# Patient Record
Sex: Male | Born: 1965 | Race: Black or African American | Hispanic: No | Marital: Single | Smoking: Never smoker
Health system: Southern US, Community
[De-identification: ages and names within clinical notes are randomized; demographics above are authoritative.]

## PROBLEM LIST (undated history)

## (undated) DIAGNOSIS — U071 COVID-19: Secondary | ICD-10-CM

## (undated) HISTORY — DX: COVID-19: U07.1

---

## 2019-06-24 ENCOUNTER — Other Ambulatory Visit: Payer: Self-pay

## 2019-06-24 ENCOUNTER — Inpatient Hospital Stay (HOSPITAL_COMMUNITY)
Admission: EM | Admit: 2019-06-24 | Discharge: 2019-06-29 | DRG: 177 | Disposition: A | Discharge status: Home / Self Care | Payer: HRSA Program | Attending: Internal Medicine | Admitting: Internal Medicine

## 2019-06-24 ENCOUNTER — Encounter (HOSPITAL_COMMUNITY): Payer: Self-pay | Admitting: Emergency Medicine

## 2019-06-24 ENCOUNTER — Emergency Department (HOSPITAL_COMMUNITY): Payer: HRSA Program

## 2019-06-24 DIAGNOSIS — A4189 Other specified sepsis: Secondary | ICD-10-CM | POA: Diagnosis not present

## 2019-06-24 DIAGNOSIS — J9601 Acute respiratory failure with hypoxia: Secondary | ICD-10-CM | POA: Diagnosis present

## 2019-06-24 DIAGNOSIS — E860 Dehydration: Secondary | ICD-10-CM | POA: Diagnosis present

## 2019-06-24 DIAGNOSIS — R6520 Severe sepsis without septic shock: Secondary | ICD-10-CM | POA: Diagnosis present

## 2019-06-24 DIAGNOSIS — N179 Acute kidney failure, unspecified: Secondary | ICD-10-CM | POA: Diagnosis present

## 2019-06-24 DIAGNOSIS — Z7982 Long term (current) use of aspirin: Secondary | ICD-10-CM | POA: Diagnosis not present

## 2019-06-24 DIAGNOSIS — J1282 Pneumonia due to coronavirus disease 2019: Secondary | ICD-10-CM | POA: Diagnosis present

## 2019-06-24 DIAGNOSIS — U071 COVID-19: Secondary | ICD-10-CM | POA: Diagnosis present

## 2019-06-24 DIAGNOSIS — A419 Sepsis, unspecified organism: Secondary | ICD-10-CM | POA: Diagnosis not present

## 2019-06-24 DIAGNOSIS — E876 Hypokalemia: Secondary | ICD-10-CM | POA: Diagnosis not present

## 2019-06-24 DIAGNOSIS — J1289 Other viral pneumonia: Secondary | ICD-10-CM | POA: Diagnosis present

## 2019-06-24 LAB — CBC WITH DIFFERENTIAL/PLATELET
Abs Immature Granulocytes: 0.06 10*3/uL (ref 0.00–0.07)
Basophils Absolute: 0 10*3/uL (ref 0.0–0.1)
Basophils Relative: 0 %
Eosinophils Absolute: 0 10*3/uL (ref 0.0–0.5)
Eosinophils Relative: 0 %
HCT: 44.9 % (ref 39.0–52.0)
Hemoglobin: 13.9 g/dL (ref 13.0–17.0)
Immature Granulocytes: 1 %
Lymphocytes Relative: 18 %
Lymphs Abs: 1.7 10*3/uL (ref 0.7–4.0)
MCH: 26.5 pg (ref 26.0–34.0)
MCHC: 31 g/dL (ref 30.0–36.0)
MCV: 85.5 fL (ref 80.0–100.0)
Monocytes Absolute: 0.5 10*3/uL (ref 0.1–1.0)
Monocytes Relative: 5 %
Neutro Abs: 7.3 10*3/uL (ref 1.7–7.7)
Neutrophils Relative %: 76 %
Platelets: 246 10*3/uL (ref 150–400)
RBC: 5.25 MIL/uL (ref 4.22–5.81)
RDW: 13.2 % (ref 11.5–15.5)
WBC: 9.6 10*3/uL (ref 4.0–10.5)
nRBC: 0 % (ref 0.0–0.2)

## 2019-06-24 LAB — SARS CORONAVIRUS 2 BY RT PCR (HOSPITAL ORDER, PERFORMED IN ~~LOC~~ HOSPITAL LAB): SARS Coronavirus 2: POSITIVE — AB

## 2019-06-24 LAB — BASIC METABOLIC PANEL
Anion gap: 14 (ref 5–15)
BUN: 10 mg/dL (ref 6–20)
CO2: 23 mmol/L (ref 22–32)
Calcium: 8.3 mg/dL — ABNORMAL LOW (ref 8.9–10.3)
Chloride: 96 mmol/L — ABNORMAL LOW (ref 98–111)
Creatinine, Ser: 1.43 mg/dL — ABNORMAL HIGH (ref 0.61–1.24)
GFR calc Af Amer: >60 mL/min (ref >60)
GFR calc non Af Amer: 55 mL/min — ABNORMAL LOW (ref >60)
Glucose, Bld: 114 mg/dL — ABNORMAL HIGH (ref 70–99)
Potassium: 3.8 mmol/L (ref 3.5–5.1)
Sodium: 133 mmol/L — ABNORMAL LOW (ref 135–145)

## 2019-06-24 LAB — TROPONIN I (HIGH SENSITIVITY)
Troponin I (High Sensitivity): 10 ng/L (ref <18)
Troponin I (High Sensitivity): 13 ng/L (ref <18)

## 2019-06-24 MED ORDER — ACETAMINOPHEN 325 MG PO TABS
650.0000 mg | ORAL_TABLET | Freq: Once | ORAL | Status: AC
  Administered 2019-06-24: 19:00:00 650 mg via ORAL
  Filled 2019-06-24: qty 2

## 2019-06-24 NOTE — ED Triage Notes (Signed)
Pt reports SHOB x 1 week. Pt reports fever, and cough. Pt denies sick contact. O2 sats 87% on room air. Pt's temp 101.4.

## 2019-06-24 NOTE — H&P (Signed)
History and Physical  Thomas Saunders WUJ:[id number] DOB: 1966-04-29 DOA: 06/24/2019  Referring physician: ER provider PCP: No primary care provider on file.  Outpatient Specialists:    Patient coming from: Home  Chief Complaint: Fever and shortness of breath for 1 week.  HPI: Patient is a 53 year old with no significant past medical history.  Patient lives with his sister who works as a care.  Patient presents with 1 week history of fever, shortness of breath and cough.  The cough is nonproductive.  No headache, no neck pain, no chest pain, no GI symptoms, no urinary symptoms, no loss of taste or smell.  On presentation to the hospital, patient was found to have positive COVID test.  Sodium is 133 with serum creatinine of 1.43.  Patient's baseline is unknown.  Patient will be admitted for further assessment and management.  ED Course: On presentation, temperature was 101.4 F, heart rate of 76, respiratory rate of 18 to 41/min, blood pressure 113/71 and O2 sat of 87 to 98%.  Lab work is as documented below.  COVID-19 repeat test came back positive.  Sodium is 133, serum creatinine is 1.43.  Chest x-ray reveals "Bilateral hazy densities concerning for developing infiltrate".  Pertinent labs: Chemistry reveals sodium of 133, potassium of 3.8, chloride of 96, CO2 23, BUN of 10, creatinine of 1.43 with a blood sugar of 114.  High sensitive troponin is negative on 2 occasions.  CBC reveals WBC of 9.6, hemoglobin of 13.9, hematocrit of 44.9, MCV of 85.5 with platelet count of 246.  This is positive.  Chest x-ray is as documented above.  EKG: Independently reviewed.   Imaging: independently reviewed.   Review of Systems: Negative for visual changes, sore throat, rash, new muscle aches, chest pain, dysuria, bleeding, n/v/abdominal pain.  History reviewed. No pertinent past medical history.  History reviewed. No pertinent surgical history.   reports that he has never smoked. He has never used  smokeless tobacco. He reports that he does not drink alcohol or use drugs.  No Known Allergies  No family history on file.   Prior to Admission medications   Medication Sig Start Date End Date Taking? Authorizing Provider  aspirin EC 81 MG tablet Take 81 mg by mouth daily.   Yes [provider]    Physical Exam: Vitals:   06/24/19 2100 06/24/19 2130 06/24/19 2200 06/24/19 2230  BP: 118/74 114/84 126/78 113/71  Pulse: 81 82 79 76  Resp: (!) 37 (!) 21 (!) 41 (!) 32  Temp:      TempSrc:      SpO2: 97% 97% 97% 96%    Constitutional:  . Appears calm and comfortable Eyes:  . No pallor. No jaundice.  ENMT:  . external ears, nose appear normal Neck:  . Neck is supple. No JVD Respiratory:  . CTA bilaterally, no w/r/r.  . Respiratory effort normal. No retractions or accessory muscle use Cardiovascular:  . S1S2 . No LE extremity edema   Abdomen:  . Abdomen is soft and non tender. Organs are difficult to assess. Neurologic:  . Awake and alert. . Moves all limbs.  Wt Readings from Last 3 Encounters:  No data found for Wt    I have personally reviewed following labs and imaging studies  Labs on Admission:  CBC: Recent Labs  Lab 06/24/19 1756  WBC 9.6  NEUTROABS 7.3  HGB 13.9  HCT 44.9  MCV 85.5  PLT 246   Basic Metabolic Panel: Recent Labs  Lab 06/24/19  1755  NA 133*  K 3.8  CL 96*  CO2 23  GLUCOSE 114*  BUN 10  CREATININE 1.43*  CALCIUM 8.3*   Liver Function Tests: No results for input(s): AST, ALT, ALKPHOS, BILITOT, PROT, ALBUMIN in the last 168 hours. No results for input(s): LIPASE, AMYLASE in the last 168 hours. No results for input(s): AMMONIA in the last 168 hours. Coagulation Profile: No results for input(s): INR, PROTIME in the last 168 hours. Cardiac Enzymes: No results for input(s): CKTOTAL, CKMB, CKMBINDEX, TROPONINI in the last 168 hours. BNP (last 3 results) No results for input(s): PROBNP in the last 8760 hours. HbA1C: No  results for input(s): HGBA1C in the last 72 hours. CBG: No results for input(s): GLUCAP in the last 168 hours. Lipid Profile: No results for input(s): CHOL, HDL, LDLCALC, TRIG, CHOLHDL, LDLDIRECT in the last 72 hours. Thyroid Function Tests: No results for input(s): TSH, T4TOTAL, FREET4, T3FREE, THYROIDAB in the last 72 hours. Anemia Panel: No results for input(s): VITAMINB12, FOLATE, FERRITIN, TIBC, IRON, RETICCTPCT in the last 72 hours. Urine analysis: No results found for: COLORURINE, APPEARANCEUR, LABSPEC, PHURINE, GLUCOSEU, HGBUR, BILIRUBINUR, KETONESUR, PROTEINUR, UROBILINOGEN, NITRITE, LEUKOCYTESUR Sepsis Labs: @LABRCNTIP (procalcitonin:4,lacticidven:4) ) Recent Results (from the past 240 hour(s))  SARS Coronavirus 2 (CEPHEID- Performed in [place] hospital lab), Hosp Order     Status: Abnormal   Collection Time: 06/24/19  6:04 PM   Specimen: Nasopharyngeal Swab  Result Value Ref Range Status   SARS Coronavirus 2 POSITIVE (A) NEGATIVE Final    Comment: RESULT CALLED TO, READ BACK BY AND VERIFIED WITH: A BANKS RN 06/24/19 2018 JDW (NOTE) If result is NEGATIVE SARS-CoV-2 target nucleic acids are NOT DETECTED. The SARS-CoV-2 RNA is generally detectable in upper and lower  respiratory specimens during the acute phase of infection. The lowest  concentration of SARS-CoV-2 viral copies this assay can detect is 250  copies / mL. A negative result does not preclude SARS-CoV-2 infection  and should not be used as the sole basis for treatment or other  patient management decisions.  A negative result may occur with  improper specimen collection / handling, submission of specimen other  than nasopharyngeal swab, presence of viral mutation(s) within the  areas targeted by this assay, and inadequate number of viral copies  (<250 copies / mL). A negative result must be combined with clinical  observations, patient history, and epidemiological information. If result is POSITIVE  SARS-CoV-2 target nucleic acids are DETECTED. The SARS- CoV-2 RNA is generally detectable in upper and lower  respiratory specimens during the acute phase of infection.  Positive  results are indicative of active infection with SARS-CoV-2.  Clinical  correlation with patient history and other diagnostic information is  necessary to determine patient infection status.  Positive results do  not rule out bacterial infection or co-infection with other viruses. If result is PRESUMPTIVE POSTIVE SARS-CoV-2 nucleic acids MAY BE PRESENT.   A presumptive positive result was obtained on the submitted specimen  and confirmed on repeat testing.  While 2019 novel coronavirus  (SARS-CoV-2) nucleic acids may be present in the submitted sample  additional confirmatory testing may be necessary for epidemiological  and / or clinical management purposes  to differentiate between  SARS-CoV-2 and other Sarbecovirus currently known to infect humans.  If clinically indicated additional testing with an alternate test  methodology [phone]) is advised . The SARS-CoV-2 RNA is generally  detectable in upper and lower respiratory specimens during the acute  phase of infection. The expected  result is Negative. Fact Sheet for Patients:  BoilerBrush.com.cyhttps://www.fda.gov/media/136312/download Fact Sheet for Healthcare Providers: https://pope.com/https://www.fda.gov/media/136313/download This test is not yet approved or cleared by the [place] FDA and has been authorized for detection and/or diagnosis of SARS-CoV-2 by FDA under an Emergency Use Authorization (EUA).  This EUA will remain in effect (meaning this test can be used) for the duration of the COVID-19 declaration under Section 564(b)(1) of the Act, 21 U.S.C. section 360bbb-3(b)(1), unless the authorization is terminated or revoked sooner. Performed at [facility], [address] [place], [place] [address]       Radiological Exams on Admission: Dg Chest Portable 1 View   Result Date: 06/24/2019 CLINICAL DATA:  53 year old male with shortness of breath and cough. EXAM: PORTABLE CHEST 1 VIEW COMPARISON:  None. FINDINGS: Patchy bilateral hazy densities primarily involving the mid to lower lung field noted which although may be related to atelectatic changes are concerning for developing infiltrate. Clinical correlation is recommended. No pleural effusion or pneumothorax. The cardiac silhouette is within normal limits. No acute osseous pathology. IMPRESSION: Bilateral hazy densities concerning for developing infiltrate. Electronically Signed   By: Elgie CollardArash  Radparvar M.D.   On: 06/24/2019 19:15    EKG: Independently reviewed.   Active Problems:   * No active hospital problems. *   Assessment/Plan Pneumonia secondary to COVID-19: Admit patient for further assessment and management Check inflammatory markers daily. Dexamethasone 6 mg p.o. once daily, for likely 10 days Precautions for COVID-19 Further management will depend on hospital course  Acute hypoxic respiratory failure secondary to COVID-19: Supplemental oxygen Steroids Managed supportively.   DVT prophylaxis: Subcute Lovenox Code Status: Full code Family Communication:  Disposition Plan: This will depend on hospital course Consults called: None Admission status: Inpatient  Time spent: 65 minutes  Berton MountSylvester Garima Chronis, MD  [facility] Pager #: [phone] 7PM-7AM contact night coverage as above  06/24/2019, 10:42 PM

## 2019-06-24 NOTE — ED Provider Notes (Addendum)
Pt's care turned over to me at 7pm.  Labs and chest xray pending.  Pt presents short of breath.  Pt has a temp of 101. Pulse ox in 80's.  Pt on 3 liters 02.   Results returned  Covid positive  Chest xray shows pneumonia.   Hospitalist consulted for admission    Sidney Ace 06/24/19 2037    Fransico Meadow, PA-C 06/24/19 2108    Virgel Manifold, MD 06/25/19 [phone]

## 2019-06-24 NOTE — ED Provider Notes (Signed)
[facility] EMERGENCY [facility] Provider Note   CSN: [id number] Arrival date & time: 06/24/19  1644     History   Chief Complaint Chief Complaint  Patient presents with   Shortness of Breath    HPI Thomas Saunders is a 53 y.o. male.     HPI   53 year old male with shortness of breath.  Symptom onset about a week ago with mild cough, fatigue, etc.  Breathing became significantly worse yesterday. Subjective fevers.  Reports that he is without any significant past medical history.  He takes no chronic medications.  He is a non-smoker.  No sick contacts that he is aware of.  No unusual leg pain or swelling.  History reviewed. No pertinent past medical history.  There are no active problems to display for this patient.   History reviewed. No pertinent surgical history.      Home Medications    Prior to Admission medications   Not on File    Family History No family history on file.  Social History Social History   Tobacco Use   Smoking status: Never Smoker   Smokeless tobacco: Never Used  Substance Use Topics   Alcohol use: Never    Frequency: Never   Drug use: Never     Allergies   Patient has no known allergies.   Review of Systems Review of Systems  All systems reviewed and negative, other than as noted in HPI.  Physical Exam Updated Vital Signs BP 130/76    Pulse 99    Temp (!) 101.4 F (38.6 C) (Oral)    Resp 18    SpO2 (!) 87%   Physical Exam Vitals signs and nursing note reviewed.  Constitutional:      General: He is not in acute distress.    Appearance: He is well-developed.  HENT:     Head: Normocephalic and atraumatic.  Eyes:     General:        Right eye: No discharge.        Left eye: No discharge.     Conjunctiva/sclera: Conjunctivae normal.  Neck:     Musculoskeletal: Neck supple.  Cardiovascular:     Rate and Rhythm: Normal rate and regular rhythm.     Heart sounds: Normal heart sounds. No murmur.  No friction rub. No gallop.   Pulmonary:     Effort: Pulmonary effort is normal. Tachypnea present. No respiratory distress.     Breath sounds: Normal breath sounds.  Abdominal:     General: There is no distension.     Palpations: Abdomen is soft.     Tenderness: There is no abdominal tenderness.  Musculoskeletal:        General: No tenderness.     Comments: Lower extremities symmetric as compared to each other. No calf tenderness. Negative Homan's. No palpable cords.   Skin:    General: Skin is warm and dry.  Neurological:     Mental Status: He is alert.  Psychiatric:        Behavior: Behavior normal.        Thought Content: Thought content normal.      ED Treatments / Results  Labs (all labs ordered are listed, but only abnormal results are displayed) Labs Reviewed  SARS CORONAVIRUS 2 (HOSPITAL ORDER, PERFORMED IN [facility]) - Abnormal; Notable for the following components:      Result Value   SARS Coronavirus 2 POSITIVE (*)    All other components within  normal limits  BASIC METABOLIC PANEL - Abnormal; Notable for the following components:   Sodium 133 (*)    Chloride 96 (*)    Glucose, Bld 114 (*)    Creatinine, Ser 1.43 (*)    Calcium 8.3 (*)    GFR calc non Af Amer 55 (*)    All other components within normal limits  CREATININE, SERUM - Abnormal; Notable for the following components:   Creatinine, Ser 1.27 (*)    All other components within normal limits  FIBRINOGEN - Abnormal; Notable for the following components:   Fibrinogen 760 (*)    All other components within normal limits  CBC WITH DIFFERENTIAL/PLATELET - Abnormal; Notable for the following components:   Hemoglobin 12.5 (*)    Neutro Abs 8.0 (*)    All other components within normal limits  D-DIMER, QUANTITATIVE (NOT AT [facility]) - Abnormal; Notable for the following components:   D-Dimer, Quant 1.13 (*)    All other components within normal limits  COMPREHENSIVE METABOLIC PANEL - Abnormal;  Notable for the following components:   Potassium 3.4 (*)    Glucose, Bld 114 (*)    Creatinine, Ser 1.26 (*)    Calcium 8.1 (*)    Albumin 2.8 (*)    All other components within normal limits  D-DIMER, QUANTITATIVE (NOT AT [facility]) - Abnormal; Notable for the following components:   D-Dimer, Quant 1.24 (*)    All other components within normal limits  EXPECTORATED SPUTUM ASSESSMENT W REFEX TO RESP CULTURE  CBC WITH DIFFERENTIAL/PLATELET  CBC  FERRITIN  PROCALCITONIN  TRIGLYCERIDES  FERRITIN  MAGNESIUM  PHOSPHORUS  TRIGLYCERIDES  OSMOLALITY  HIV ANTIBODY (ROUTINE TESTING W REFLEX)  INTERLEUKIN-6, PLASMA  LEGIONELLA PNEUMOPHILA SEROGP 1 UR AG  STREP PNEUMONIAE URINARY ANTIGEN  URINALYSIS, ROUTINE W REFLEX MICROSCOPIC  SODIUM, URINE, RANDOM  OSMOLALITY, URINE  ABO/RH  TROPONIN I (HIGH SENSITIVITY)  TROPONIN I (HIGH SENSITIVITY)    EKG EKG Interpretation  Date/Time:  Thursday June 24 2019 16:57:29 EDT Ventricular Rate:  100 PR Interval:  130 QRS Duration: 82 QT Interval:  328 QTC Calculation: 423 R Axis:   69 Text Interpretation:  Normal sinus rhythm Cannot rule out Anterior infarct , age undetermined Abnormal ECG Confirmed by Raeford RazorKohut, Aivah Putman [phone]([id number]) on 06/24/2019 6:49:25 PM   Radiology Dg Chest Portable 1 View  Result Date: 06/24/2019 CLINICAL DATA:  53 year old male with shortness of breath and cough. EXAM: PORTABLE CHEST 1 VIEW COMPARISON:  None. FINDINGS: Patchy bilateral hazy densities primarily involving the mid to lower lung field noted which although may be related to atelectatic changes are concerning for developing infiltrate. Clinical correlation is recommended. No pleural effusion or pneumothorax. The cardiac silhouette is within normal limits. No acute osseous pathology. IMPRESSION: Bilateral hazy densities concerning for developing infiltrate. Electronically Signed   By: Elgie CollardArash  Radparvar M.D.   On: 06/24/2019 19:15    Procedures Procedures (including  critical care time)  Medications Ordered in ED Medications - No data to display   Initial Impression / Assessment and Plan / ED Course  I have reviewed the triage vital signs and the nursing notes.  Pertinent labs & imaging results that were available during my care of the patient were reviewed by me and considered in my medical decision making (see chart for details).   53 year old male with fever and dyspnea.  High clinical suspicion for COVID.  He will need admitted given his oxygen requirement.  Basic labs, COVID testing and chest x-ray.  Care signed  out to oncoming provider with these pending.  Endy Easterly Largo was evaluated in Emergency Department on 06/24/2019 for the symptoms described in the history of present illness. He was evaluated in the context of the global COVID-19 pandemic, which necessitated consideration that the patient might be at risk for infection with the SARS-CoV-2 virus that causes COVID-19. Institutional protocols and algorithms that pertain to the evaluation of patients at risk for COVID-19 are in a state of rapid change based on information released by regulatory bodies including the CDC and federal and state organizations. These policies and algorithms were followed during the patient's care in the ED.   Final Clinical Impressions(s) / ED Diagnoses   Final diagnoses:  COVID-19 virus infection    ED Discharge Orders    None       Virgel Manifold, MD 06/25/19 [phone]

## 2019-06-24 NOTE — ED Notes (Signed)
Pt Sa02 97% on 2L of 02 per [place]

## 2019-06-25 ENCOUNTER — Encounter (HOSPITAL_COMMUNITY): Payer: Self-pay | Admitting: Nurse Practitioner

## 2019-06-25 DIAGNOSIS — U071 COVID-19: Secondary | ICD-10-CM | POA: Insufficient documentation

## 2019-06-25 DIAGNOSIS — E876 Hypokalemia: Secondary | ICD-10-CM | POA: Diagnosis present

## 2019-06-25 DIAGNOSIS — J9601 Acute respiratory failure with hypoxia: Secondary | ICD-10-CM | POA: Diagnosis present

## 2019-06-25 DIAGNOSIS — N179 Acute kidney failure, unspecified: Secondary | ICD-10-CM | POA: Diagnosis present

## 2019-06-25 DIAGNOSIS — R6520 Severe sepsis without septic shock: Secondary | ICD-10-CM | POA: Diagnosis present

## 2019-06-25 DIAGNOSIS — A419 Sepsis, unspecified organism: Secondary | ICD-10-CM | POA: Diagnosis present

## 2019-06-25 LAB — URINALYSIS, ROUTINE W REFLEX MICROSCOPIC
Glucose, UA: NEGATIVE mg/dL
Hgb urine dipstick: NEGATIVE
Ketones, ur: NEGATIVE mg/dL
Leukocytes,Ua: NEGATIVE
Nitrite: NEGATIVE
Protein, ur: 100 mg/dL — AB
Specific Gravity, Urine: 1.025 (ref 1.005–1.030)
pH: 6 (ref 5.0–8.0)

## 2019-06-25 LAB — SODIUM, URINE, RANDOM: Sodium, Ur: 15 mmol/L

## 2019-06-25 LAB — COMPREHENSIVE METABOLIC PANEL
ALT: 24 U/L (ref 0–44)
AST: 39 U/L (ref 15–41)
Albumin: 2.8 g/dL — ABNORMAL LOW (ref 3.5–5.0)
Alkaline Phosphatase: 41 U/L (ref 38–126)
Anion gap: 12 (ref 5–15)
BUN: 9 mg/dL (ref 6–20)
CO2: 25 mmol/L (ref 22–32)
Calcium: 8.1 mg/dL — ABNORMAL LOW (ref 8.9–10.3)
Chloride: 98 mmol/L (ref 98–111)
Creatinine, Ser: 1.26 mg/dL — ABNORMAL HIGH (ref 0.61–1.24)
GFR calc Af Amer: >60 mL/min (ref >60)
GFR calc non Af Amer: >60 mL/min (ref >60)
Glucose, Bld: 114 mg/dL — ABNORMAL HIGH (ref 70–99)
Potassium: 3.4 mmol/L — ABNORMAL LOW (ref 3.5–5.1)
Sodium: 135 mmol/L (ref 135–145)
Total Bilirubin: 0.8 mg/dL (ref 0.3–1.2)
Total Protein: 6.8 g/dL (ref 6.5–8.1)

## 2019-06-25 LAB — CREATININE, SERUM
Creatinine, Ser: 1.27 mg/dL — ABNORMAL HIGH (ref 0.61–1.24)
GFR calc Af Amer: >60 mL/min (ref >60)
GFR calc non Af Amer: >60 mL/min (ref >60)

## 2019-06-25 LAB — CBC
HCT: 41.7 % (ref 39.0–52.0)
Hemoglobin: 13.3 g/dL (ref 13.0–17.0)
MCH: 26.4 pg (ref 26.0–34.0)
MCHC: 31.9 g/dL (ref 30.0–36.0)
MCV: 82.9 fL (ref 80.0–100.0)
Platelets: 280 K/uL (ref 150–400)
RBC: 5.03 MIL/uL (ref 4.22–5.81)
RDW: 13.2 % (ref 11.5–15.5)
WBC: 9.6 K/uL (ref 4.0–10.5)
nRBC: 0 % (ref 0.0–0.2)

## 2019-06-25 LAB — STREP PNEUMONIAE URINARY ANTIGEN: Strep Pneumo Urinary Antigen: NEGATIVE

## 2019-06-25 LAB — CBC WITH DIFFERENTIAL/PLATELET
Abs Immature Granulocytes: 0.05 10*3/uL (ref 0.00–0.07)
Basophils Absolute: 0 10*3/uL (ref 0.0–0.1)
Basophils Relative: 0 %
Eosinophils Absolute: 0 10*3/uL (ref 0.0–0.5)
Eosinophils Relative: 0 %
HCT: 39 % (ref 39.0–52.0)
Hemoglobin: 12.5 g/dL — ABNORMAL LOW (ref 13.0–17.0)
Immature Granulocytes: 1 %
Lymphocytes Relative: 14 %
Lymphs Abs: 1.4 10*3/uL (ref 0.7–4.0)
MCH: 26.7 pg (ref 26.0–34.0)
MCHC: 32.1 g/dL (ref 30.0–36.0)
MCV: 83.3 fL (ref 80.0–100.0)
Monocytes Absolute: 0.4 10*3/uL (ref 0.1–1.0)
Monocytes Relative: 4 %
Neutro Abs: 8 10*3/uL — ABNORMAL HIGH (ref 1.7–7.7)
Neutrophils Relative %: 81 %
Platelets: 277 10*3/uL (ref 150–400)
RBC: 4.68 MIL/uL (ref 4.22–5.81)
RDW: 13.3 % (ref 11.5–15.5)
WBC: 9.9 10*3/uL (ref 4.0–10.5)
nRBC: 0 % (ref 0.0–0.2)

## 2019-06-25 LAB — D-DIMER, QUANTITATIVE
D-Dimer, Quant: 1.24 ug/mL-FEU — ABNORMAL HIGH (ref 0.00–0.50)
D-Dimer, Quant: 1.13 ug/mL-FEU — ABNORMAL HIGH (ref 0.00–0.50)

## 2019-06-25 LAB — MRSA PCR SCREENING: MRSA by PCR: NEGATIVE

## 2019-06-25 LAB — URINALYSIS, MICROSCOPIC (REFLEX)
RBC / HPF: NONE SEEN RBC/hpf (ref 0–5)
Squamous Epithelial / HPF: NONE SEEN (ref 0–5)
WBC, UA: NONE SEEN WBC/hpf (ref 0–5)

## 2019-06-25 LAB — PROCALCITONIN: Procalcitonin: 0.48 ng/mL

## 2019-06-25 LAB — PHOSPHORUS: Phosphorus: 3 mg/dL (ref 2.5–4.6)

## 2019-06-25 LAB — FIBRINOGEN: Fibrinogen: 760 mg/dL — ABNORMAL HIGH (ref 210–475)

## 2019-06-25 LAB — MAGNESIUM: Magnesium: 2.2 mg/dL (ref 1.7–2.4)

## 2019-06-25 LAB — OSMOLALITY, URINE: Osmolality, Ur: 771 mOsm/kg (ref 300–900)

## 2019-06-25 LAB — TRIGLYCERIDES
Triglycerides: 76 mg/dL (ref <150)
Triglycerides: 81 mg/dL (ref <150)

## 2019-06-25 LAB — FERRITIN
Ferritin: 279 ng/mL (ref 24–336)
Ferritin: 290 ng/mL (ref 24–336)

## 2019-06-25 LAB — OSMOLALITY: Osmolality: 282 mOsm/kg (ref 275–295)

## 2019-06-25 LAB — C-REACTIVE PROTEIN: CRP: 20.7 mg/dL — ABNORMAL HIGH (ref <1.0)

## 2019-06-25 LAB — LACTIC ACID, PLASMA
Lactic Acid, Venous: 1.9 mmol/L (ref 0.5–1.9)
Lactic Acid, Venous: 1.2 mmol/L (ref 0.5–1.9)

## 2019-06-25 LAB — ABO/RH: ABO/RH(D): O POS

## 2019-06-25 LAB — HIV ANTIBODY (ROUTINE TESTING W REFLEX): HIV Screen 4th Generation wRfx: NONREACTIVE

## 2019-06-25 MED ORDER — ENOXAPARIN SODIUM 40 MG/0.4ML ~~LOC~~ SOLN
40.0000 mg | SUBCUTANEOUS | Status: DC
  Administered 2019-06-25 – 2019-06-28 (×4): 40 mg via SUBCUTANEOUS
  Filled 2019-06-25 (×5): qty 0.4

## 2019-06-25 MED ORDER — SODIUM CHLORIDE 0.9 % IV SOLN
200.0000 mg | Freq: Once | INTRAVENOUS | Status: AC
  Administered 2019-06-25: 10:00:00 200 mg via INTRAVENOUS
  Filled 2019-06-25: qty 40

## 2019-06-25 MED ORDER — ACETAMINOPHEN 325 MG PO TABS
650.0000 mg | ORAL_TABLET | Freq: Four times a day (QID) | ORAL | Status: DC | PRN
  Administered 2019-06-25: 08:00:00 650 mg via ORAL
  Filled 2019-06-25: qty 2

## 2019-06-25 MED ORDER — ASPIRIN EC 81 MG PO TBEC
81.0000 mg | DELAYED_RELEASE_TABLET | Freq: Every day | ORAL | Status: DC
  Administered 2019-06-25 – 2019-06-29 (×5): 81 mg via ORAL
  Filled 2019-06-25 (×5): qty 1

## 2019-06-25 MED ORDER — SODIUM CHLORIDE 0.9 % IV SOLN
100.0000 mg | INTRAVENOUS | Status: AC
  Administered 2019-06-26 – 2019-06-29 (×4): 100 mg via INTRAVENOUS
  Filled 2019-06-25 (×5): qty 20

## 2019-06-25 MED ORDER — TOCILIZUMAB 400 MG/20ML IV SOLN
8.0000 mg/kg | Freq: Once | INTRAVENOUS | Status: AC
  Administered 2019-06-25: 708 mg via INTRAVENOUS
  Filled 2019-06-25: qty 35.4

## 2019-06-25 MED ORDER — SODIUM CHLORIDE 0.9 % IV SOLN
500.0000 mg | INTRAVENOUS | Status: DC
  Administered 2019-06-25: 03:00:00 500 mg via INTRAVENOUS
  Filled 2019-06-25: qty 500

## 2019-06-25 MED ORDER — DEXAMETHASONE 6 MG PO TABS
6.0000 mg | ORAL_TABLET | Freq: Every day | ORAL | Status: DC
  Administered 2019-06-25 – 2019-06-26 (×2): 6 mg via ORAL
  Filled 2019-06-25 (×2): qty 2

## 2019-06-25 MED ORDER — TRAZODONE HCL 50 MG PO TABS
50.0000 mg | ORAL_TABLET | Freq: Once | ORAL | Status: AC
  Administered 2019-06-25: 50 mg via ORAL
  Filled 2019-06-25: qty 1

## 2019-06-25 MED ORDER — POTASSIUM CHLORIDE CRYS ER 20 MEQ PO TBCR
40.0000 meq | EXTENDED_RELEASE_TABLET | Freq: Once | ORAL | Status: AC
  Administered 2019-06-25: 08:00:00 40 meq via ORAL
  Filled 2019-06-25: qty 2

## 2019-06-25 NOTE — ED Notes (Signed)
Private encounter on pt account which takes place of XXX. When family called asking about pt I told them I didn't have anyone in my system with that name. They then ask to speak to the director so I called Darci Current who was also charge and was told per Darci Current to ask the pt if they wanted to speak to the family.

## 2019-06-25 NOTE — Progress Notes (Signed)
Daily Nursing Note  Received report from [place], [place]. Patient arrived around 11:10, Mobilized from gurney to bed with noted unsteadiness.  Oriented to unit and safety precautions. Skin integrity intact, CHG bath completed, MRSA swab completed, placed on progressive monitoring, noted to be on 5LPM Advance. ACTEMRA started. Situated in room, provided some oral hydration. All needs met upon arrival to floor.

## 2019-06-25 NOTE — ED Notes (Signed)
Paged admitting MD about pt's temp. Awaiting orders

## 2019-06-25 NOTE — ED Notes (Signed)
Requested Tylenol for pt's fever from floor coverage MD

## 2019-06-25 NOTE — Progress Notes (Addendum)
PROGRESS NOTE                                                                                                                                                                                                             Patient Demographics:    Thomas Saunders, is a 53 y.o. male, DOB - May 19, 1966, NWG:[id number]  Admit date - 06/24/2019   Admitting Physician No admitting provider for patient encounter.  Outpatient Primary MD for the patient is Patient, No Pcp Per  LOS - 1  Outpatient Specialists: None  Chief Complaint  Patient presents with  . Shortness of Breath       Brief Narrative 53 year old male with no significant past medical history.  He told the admitting physician that he lives with his sister but tells me that he lives by himself.  He reported 1 week history of fever, shortness of breath and nonproductive cough.  Denies any headache, chest pain, palpitations, nausea, vomiting, abdominal pain, dysuria, loss of taste or smell or diarrhea.  Denies that he has been getting out of the house or having any sick contact.  In the ED he septic with fever of 101.4 F, heart rate of 76, tachypneic in the 30s up to 40, stable blood pressure and O2 sat of 87% on room air.  COVID-19 was tested positive.  Blood work showed sodium of 133, creatinine 1.43 chest x-ray showed bilateral hazy density concerning for developing infiltrate.  High sensitive troponin was negative.  Normal CBC. Patient placed on supplemental oxygen (currently requiring 5 L via nasal cannula and maintaining sats in the mid 90s).  Was started on Decadron 6 mg daily and admitted to be transferred to [facility] for further care.     Subjective:   Patient reports that his breathing is slightly better with treatment received.  Patient tachypneic in the 30s on exam.   Assessment  & Plan :    Active Problems: Acute respiratory failure with hypoxia (HCC)  Severe sepsis [facility](HCC) Patient meets criteria for sepsis with fever, tachypnea, hypoxic respiratory failure and acute kidney injury.  Check lactic acid.  Admitted and transferred [facility].  Patient will require monitoring in progressive care until then.  Started on dexamethasone 6 mg p.o. daily.  Airborne and contact isolation. Continue supplemental oxygen (currently requiring 5 L via nasal  cannula).  I will hold off on antibiotic at this time. Inflammatory marker work-up shows normal ferritin of 290 elevated d-dimer of 1.24>> 1.13, normal procalcitonin of 0.48, elevated fibrinogen of 760.  CRP ordered.  HIV antibody is pending. Patient would benefit from being on Actemra and has been ordered for 1 dose.  Will be reassessed in 24 hours for another dose if respiratory function not improved. Also started on IV Remdesivir.  Acute kidney injury (HCC) No baseline labs in the system.  Suspect dehydration.  Continue with hydration.  Hypokalemia Replenished    Code Status : Full code  Family Communication  : None  Disposition Plan  : To be transferred to [facility] for further care  Barriers For Discharge : Active symptoms  Consults  : None  Procedures  : None  DVT Prophylaxis  :  Lovenox -   Lab Results  Component Value Date   PLT 277 06/25/2019    Antibiotics  :    Anti-infectives (From admission, onward)   Start     Dose/Rate Route Frequency Ordered Stop   06/26/19 0930  remdesivir 100 mg in sodium chloride 0.9 % 250 mL IVPB     100 mg 500 mL/hr over 30 Minutes Intravenous Every 24 hours 06/25/19 0825 06/30/19 0929   06/25/19 0930  remdesivir 200 mg in sodium chloride 0.9 % 250 mL IVPB     200 mg 500 mL/hr over 30 Minutes Intravenous Once 06/25/19 0825     06/25/19 0200  azithromycin (ZITHROMAX) 500 mg in sodium chloride 0.9 % 250 mL IVPB     500 mg 250 mL/hr over 60 Minutes Intravenous Every 24 hours 06/25/19 0158          Objective:   Vitals:    06/25/19 0730 06/25/19 0800 06/25/19 0810 06/25/19 0811  BP: 104/66 105/66    Pulse: 79 78    Resp: (!) 35 (!) 35    Temp:      TempSrc:      SpO2: 95% 96%    Weight:   88.5 kg 88.5 kg  Height:   5\' 7"  (1.702 m) 5\' 7"  (1.702 m)    Wt Readings from Last 3 Encounters:  06/25/19 88.5 kg    No intake or output data in the 24 hours ending 06/25/19 0857   Physical Exam  Gen: Fatigued and tachypneic HEENT: no pallor, dry mucosa, supple neck Chest: clear b/l, no added sounds CVS: N S1&S2, no murmurs, rubs or gallop GI: soft, NT, ND, BS+ Musculoskeletal: warm, no edema     Data Review:    CBC Recent Labs  Lab 06/24/19 1756 06/25/19 0223 06/25/19 0500  WBC 9.6 9.6 9.9  HGB 13.9 13.3 12.5*  HCT 44.9 41.7 39.0  PLT 246 280 277  MCV 85.5 82.9 83.3  MCH 26.5 26.4 26.7  MCHC 31.0 31.9 32.1  RDW 13.2 13.2 13.3  LYMPHSABS 1.7  --  1.4  MONOABS 0.5  --  0.4  EOSABS 0.0  --  0.0  BASOSABS 0.0  --  0.0    Chemistries  Recent Labs  Lab 06/24/19 1755 06/25/19 0223 06/25/19 0500  NA 133*  --  135  K 3.8  --  3.4*  CL 96*  --  98  CO2 23  --  25  GLUCOSE 114*  --  114*  BUN 10  --  9  CREATININE 1.43* 1.27* 1.26*  CALCIUM 8.3*  --  8.1*  MG  --   --  2.2  AST  --   --  39  ALT  --   --  24  ALKPHOS  --   --  41  BILITOT  --   --  0.8   ------------------------------------------------------------------------------------------------------------------ Recent Labs    06/25/19 0223 06/25/19 0500  TRIG 76 81    No results found for: HGBA1C ------------------------------------------------------------------------------------------------------------------ No results for input(s): TSH, T4TOTAL, T3FREE, THYROIDAB in the last 72 hours.  Invalid input(s): FREET3 ------------------------------------------------------------------------------------------------------------------ Recent Labs    06/25/19 0223 06/25/19 0500  FERRITIN 279 290    Coagulation profile  No results for input(s): INR, PROTIME in the last 168 hours.  Recent Labs    06/25/19 0223 06/25/19 0500  DDIMER 1.24* 1.13*    Cardiac Enzymes No results for input(s): CKMB, TROPONINI, MYOGLOBIN in the last 168 hours.  Invalid input(s): CK ------------------------------------------------------------------------------------------------------------------ No results found for: BNP  Inpatient Medications  Scheduled Meds: . aspirin EC  81 mg Oral Daily  . dexamethasone  6 mg Oral Daily  . enoxaparin (LOVENOX) injection  40 mg Subcutaneous Q24H   Continuous Infusions: . azithromycin Stopped (06/25/19 0430)  . remdesivir 200 mg in NS 250 mL     Followed by  . [START ON 06/26/2019] remdesivir 100 mg in NS 250 mL    . tocilizumab (ACTEMRA) IV     PRN Meds:.acetaminophen  Micro Results Recent Results (from the past 240 hour(s))  SARS Coronavirus 2 (CEPHEID- Performed in [place] hospital lab), Hosp Order     Status: Abnormal   Collection Time: 06/24/19  6:04 PM   Specimen: Nasopharyngeal Swab  Result Value Ref Range Status   SARS Coronavirus 2 POSITIVE (A) NEGATIVE Final    Comment: RESULT CALLED TO, READ BACK BY AND VERIFIED WITH: A BANKS RN 06/24/19 2018 JDW (NOTE) If result is NEGATIVE SARS-CoV-2 target nucleic acids are NOT DETECTED. The SARS-CoV-2 RNA is generally detectable in upper and lower  respiratory specimens during the acute phase of infection. The lowest  concentration of SARS-CoV-2 viral copies this assay can detect is 250  copies / mL. A negative result does not preclude SARS-CoV-2 infection  and should not be used as the sole basis for treatment or other  patient management decisions.  A negative result may occur with  improper specimen collection / handling, submission of specimen other  than nasopharyngeal swab, presence of viral mutation(s) within the  areas targeted by this assay, and inadequate number of viral copies  (<250 copies / mL). A negative  result must be combined with clinical  observations, patient history, and epidemiological information. If result is POSITIVE SARS-CoV-2 target nucleic acids are DETECTED. The SARS- CoV-2 RNA is generally detectable in upper and lower  respiratory specimens during the acute phase of infection.  Positive  results are indicative of active infection with SARS-CoV-2.  Clinical  correlation with patient history and other diagnostic information is  necessary to determine patient infection status.  Positive results do  not rule out bacterial infection or co-infection with other viruses. If result is PRESUMPTIVE POSTIVE SARS-CoV-2 nucleic acids MAY BE PRESENT.   A presumptive positive result was obtained on the submitted specimen  and confirmed on repeat testing.  While 2019 novel coronavirus  (SARS-CoV-2) nucleic acids may be present in the submitted sample  additional confirmatory testing may be necessary for epidemiological  and / or clinical management purposes  to differentiate between  SARS-CoV-2 and other Sarbecovirus currently known to infect humans.  If clinically indicated additional testing with  an alternate test  methodology [phone] is advised . The SARS-CoV-2 RNA is generally  detectable in upper and lower respiratory specimens during the acute  phase of infection. The expected result is Negative. Fact Sheet for Patients:  BoilerBrush.com.cyhttps://www.fda.gov/media/136312/download Fact Sheet for Healthcare Providers: https://pope.com/https://www.fda.gov/media/136313/download This test is not yet approved or cleared by the [place] FDA and has been authorized for detection and/or diagnosis of SARS-CoV-2 by FDA under an Emergency Use Authorization (EUA).  This EUA will remain in effect (meaning this test can be used) for the duration of the COVID-19 declaration under Section 564(b)(1) of the Act, 21 U.S.C. section 360bbb-3(b)(1), unless the authorization is terminated or revoked sooner. Performed at [facility], [address] [place], [place] [address]     Radiology Reports Dg Chest Portable 1 View  Result Date: 06/24/2019 CLINICAL DATA:  53 year old male with shortness of breath and cough. EXAM: PORTABLE CHEST 1 VIEW COMPARISON:  None. FINDINGS: Patchy bilateral hazy densities primarily involving the mid to lower lung field noted which although may be related to atelectatic changes are concerning for developing infiltrate. Clinical correlation is recommended. No pleural effusion or pneumothorax. The cardiac silhouette is within normal limits. No acute osseous pathology. IMPRESSION: Bilateral hazy densities concerning for developing infiltrate. Electronically Signed   By: Elgie CollardArash  Radparvar M.D.   On: 06/24/2019 19:15    Time Spent in minutes 35   Olaf Mesa M.D on 06/25/2019 at 8:57 AM  Between 7am to 7pm - Pager - [phone]  After 7pm go to [email or web address] - password [email or web address]  [facility] -  Office  [phone]

## 2019-06-25 NOTE — ED Notes (Signed)
Breakfast tray ordered 

## 2019-06-25 NOTE — ED Notes (Signed)
Requested medication to help pt sleep from floor coverage MD

## 2019-06-25 NOTE — ED Notes (Signed)
O2 increased to 6 LPM via [place]

## 2019-06-25 NOTE — ED Notes (Signed)
Per Liane Comber, pharmacist in main lab, [place] to come from [place] [facility]; will administer upon arrival to [facility]

## 2019-06-26 DIAGNOSIS — U071 COVID-19: Secondary | ICD-10-CM | POA: Diagnosis not present

## 2019-06-26 LAB — CBC WITH DIFFERENTIAL/PLATELET
Abs Immature Granulocytes: 0 10*3/uL (ref 0.00–0.07)
Basophils Absolute: 0 10*3/uL (ref 0.0–0.1)
Basophils Relative: 0 %
Eosinophils Absolute: 0 10*3/uL (ref 0.0–0.5)
Eosinophils Relative: 0 %
HCT: 43.4 % (ref 39.0–52.0)
Hemoglobin: 14.1 g/dL (ref 13.0–17.0)
Lymphocytes Relative: 22 %
Lymphs Abs: 1.8 10*3/uL (ref 0.7–4.0)
MCH: 26.7 pg (ref 26.0–34.0)
MCHC: 32.5 g/dL (ref 30.0–36.0)
MCV: 82 fL (ref 80.0–100.0)
Monocytes Absolute: 0.2 10*3/uL (ref 0.1–1.0)
Monocytes Relative: 2 %
Neutro Abs: 6.2 10*3/uL (ref 1.7–7.7)
Neutrophils Relative %: 76 %
Platelets: 359 10*3/uL (ref 150–400)
RBC: 5.29 MIL/uL (ref 4.22–5.81)
RDW: 13.2 % (ref 11.5–15.5)
WBC: 8.2 10*3/uL (ref 4.0–10.5)
nRBC: 0 % (ref 0.0–0.2)
nRBC: 0 /100 WBC

## 2019-06-26 LAB — D-DIMER, QUANTITATIVE: D-Dimer, Quant: 1.12 ug/mL-FEU — ABNORMAL HIGH (ref 0.00–0.50)

## 2019-06-26 LAB — LEGIONELLA PNEUMOPHILA SEROGP 1 UR AG: L. pneumophila Serogp 1 Ur Ag: NEGATIVE

## 2019-06-26 LAB — COMPREHENSIVE METABOLIC PANEL
ALT: 29 U/L (ref 0–44)
AST: 39 U/L (ref 15–41)
Albumin: 2.8 g/dL — ABNORMAL LOW (ref 3.5–5.0)
Alkaline Phosphatase: 49 U/L (ref 38–126)
Anion gap: 11 (ref 5–15)
BUN: 12 mg/dL (ref 6–20)
CO2: 28 mmol/L (ref 22–32)
Calcium: 8.7 mg/dL — ABNORMAL LOW (ref 8.9–10.3)
Chloride: 100 mmol/L (ref 98–111)
Creatinine, Ser: 0.97 mg/dL (ref 0.61–1.24)
GFR calc Af Amer: >60 mL/min (ref >60)
GFR calc non Af Amer: >60 mL/min (ref >60)
Glucose, Bld: 134 mg/dL — ABNORMAL HIGH (ref 70–99)
Potassium: 4.2 mmol/L (ref 3.5–5.1)
Sodium: 139 mmol/L (ref 135–145)
Total Bilirubin: 0.8 mg/dL (ref 0.3–1.2)
Total Protein: 7.9 g/dL (ref 6.5–8.1)

## 2019-06-26 LAB — MAGNESIUM: Magnesium: 2.7 mg/dL — ABNORMAL HIGH (ref 1.7–2.4)

## 2019-06-26 LAB — PHOSPHORUS: Phosphorus: 3.3 mg/dL (ref 2.5–4.6)

## 2019-06-26 LAB — C-REACTIVE PROTEIN: CRP: 23 mg/dL — ABNORMAL HIGH (ref <1.0)

## 2019-06-26 LAB — FERRITIN: Ferritin: 399 ng/mL — ABNORMAL HIGH (ref 24–336)

## 2019-06-26 LAB — TRIGLYCERIDES: Triglycerides: 73 mg/dL (ref <150)

## 2019-06-26 MED ORDER — TOCILIZUMAB 400 MG/20ML IV SOLN
8.0000 mg/kg | Freq: Once | INTRAVENOUS | Status: AC
  Administered 2019-06-26: 14:00:00 708 mg via INTRAVENOUS
  Filled 2019-06-26: qty 35.4

## 2019-06-26 MED ORDER — POLYETHYLENE GLYCOL 3350 17 G PO PACK: 17.0000 g | PACK | Freq: Every day | ORAL | Status: DC | PRN

## 2019-06-26 MED ORDER — ONDANSETRON HCL 4 MG/2ML IJ SOLN: 4.0000 mg | Freq: Four times a day (QID) | INTRAMUSCULAR | Status: DC | PRN

## 2019-06-26 MED ORDER — ALBUTEROL SULFATE HFA 108 (90 BASE) MCG/ACT IN AERS
2.0000 | INHALATION_SPRAY | RESPIRATORY_TRACT | Status: DC | PRN
  Filled 2019-06-26: qty 6.7

## 2019-06-26 MED ORDER — BENZONATATE 100 MG PO CAPS: 200.0000 mg | ORAL_CAPSULE | Freq: Three times a day (TID) | ORAL | Status: DC | PRN

## 2019-06-26 MED ORDER — METHYLPREDNISOLONE SODIUM SUCC 40 MG IJ SOLR
40.0000 mg | Freq: Two times a day (BID) | INTRAMUSCULAR | Status: DC
  Administered 2019-06-26 – 2019-06-28 (×6): 40 mg via INTRAVENOUS
  Filled 2019-06-26 (×6): qty 1

## 2019-06-26 NOTE — Progress Notes (Signed)
Patient's friend on his phone in room.  Per patient's request, gave brief update on patient's condition.  Otherwise, patient is keeping his family updated on his progress.  Earleen Reaper RN

## 2019-06-26 NOTE — Progress Notes (Signed)
Nurse called and given report. Informed nurse that previous documentation notes 5LNC, MD stated increased O2. Noted in room [facility] therefore per documentation in chart no changes recorded d/t no reports of [place] being decreased. Therefore by default O2 [place] [facility] [place].

## 2019-06-26 NOTE — Progress Notes (Addendum)
Chart reviewed-spoke with patient-still gets dyspneic with minimal activity-on 4-5 L of oxygen with CRP trending up.  Feels essentially the same as yesterday.  Spoke with Dr. Ainsley Spinner both agree that patient will likely benefit from a second dose of Actemra.  Will change Decadron to IV Solu-Medrol 40 mg every 12.  Have encouraged patient to keep prone 3 to 4 hours at a time up to 16 hours a day.  Have encouraged use of incentive spirometry as well.  Note-again rediscussed rationale, risks (denies hx of TB, HBV), benefits of Actemra with patient.  The rationale for the off label use of Actemra its known side effects, potential benefits was  discussed with patient.The use of Actemra is based on published clinical articles/anecdotal data and not on randomized controlled trials.  Complete risks and long-term side effects are unknown, however in the best clinical judgment they seem to have of some clinical benefit which at this time outweighs medical risks given tenuous clinical state of the patient.  Patient agree's with the treatment plan and consent to the use of Actemra.

## 2019-06-26 NOTE — Progress Notes (Signed)
Attempt to call report, requested by staff to call the nurse Kathlee Nations back in 30 min.

## 2019-06-26 NOTE — Progress Notes (Addendum)
Pt states that RN does not need to call family member to notify of arrival/update. He states "I will take care of calling everyone that needs to know". Pt educated on plan of care and update process with one person who is called BID. Pt verbalizes understanding and again states that Rn does not need to call and update.   Pt also denies knowledge of why he would have a confidential restriction on his profile. He denies any trauma's or reasons that someone should not know he was here. He requests this Rn to remove the confidential pt status. Messaged passed to CN.

## 2019-06-26 NOTE — Progress Notes (Signed)
Pt sitting up in chair

## 2019-06-26 NOTE — Progress Notes (Addendum)
Pt arrived from [facility]. Pt placed on 5L initially with O2 sat of 96%. Pt turned down to 4L and maintaining above 92%. Pt provided with IS, stated no one had given him one. Educated on how to use IS. Pt able to get 1000 on IS. Pt verbalizes understanding to use 10x per hour. Pt educated on need to get up in chair. Pt states he has been walking around the room at [facility]. Pt educated to call if he needs assistance as he is connected to tele/O2 monitoring/O2. Pt verbalizes understanding. Paged to notify of arrival to MD.   Dr Sloan Leiter aware of pt's arrival and states will come see pt shortly.

## 2019-06-26 NOTE — Progress Notes (Signed)
Received telephone report from [facility]. Per RN report, pt is on 5L O2 [place], however, this is not charted in flowsheet. Awaiting transport from [facility].

## 2019-06-26 NOTE — Progress Notes (Signed)
[facility] present to transport patient. 

## 2019-06-26 NOTE — Progress Notes (Signed)
PROGRESS NOTE                                                                                                                                                                                                             Patient Demographics:    Thomas Saunders, is a 53 y.o. male, DOB - July 26, 1966, JQB:[id number]  Admit date - 06/24/2019   Admitting Physician Louellen Molder, MD  Outpatient Primary MD for the patient is Patient, No Pcp Per  LOS - 2  Outpatient Specialists: None  Chief Complaint  Patient presents with  . Shortness of Breath       Brief Narrative 53 year old male with no significant past medical history.  He told the admitting physician that he lives with his sister but tells me that he lives by himself.  He reported 1 week history of fever, shortness of breath and nonproductive cough.  Denies any headache, chest pain, palpitations, nausea, vomiting, abdominal pain, dysuria, loss of taste or smell or diarrhea.  Denies that he has been getting out of the house or having any sick contact.  In the ED he septic with fever of 101.4 F, heart rate of 76, tachypneic in the 30s up to 40, stable blood pressure and O2 sat of 87% on room air.  COVID-19 was tested positive.  Blood work showed sodium of 133, creatinine 1.43 chest x-ray showed bilateral hazy density concerning for developing infiltrate.  High sensitive troponin was negative.  Normal CBC. Patient placed on supplemental oxygen (currently requiring 5 L via nasal cannula and maintaining sats in the mid 90s).  Was started on Decadron 6 mg daily and admitted to be transferred to [facility] for further care.     Subjective:   Patient  still gets quite dyspneic on trying to sit up in bed.  Afebrile.  On 4 L via nasal cannula, sats dropped to 88% when sitting up.   Assessment  & Plan :    Active Problems: Acute respiratory failure with hypoxia (HCC) Severe  sepsis ([place]) Secondary to COVID-19 respiratory infection.  Will be transferred to [facility] today.  Maintain airborne and contact isolation. Continue dexamethasone 6 mg daily.   Desat into high 80s on 4 L, increased to 5 L via nasal cannula. Both ferritin and CRP elevated from yesterday, d-dimer unchanged. Patient received  1 dose of Actemra on 7/24 (will be reevaluated for another dose once he arrives at [facility]). Continue IV Remdesivir.  Patient is at high risk of further respiratory decompensation.  Acute kidney injury (HCC) Prerenal secondary to dehydration.  Resolved with IV fluids.  Hypokalemia Replenished    Code Status : Full code  Family Communication  : None  Disposition Plan  : To be transferred to [facility] care  Barriers For Discharge : Active symptoms  Consults  : None  Procedures  : None  DVT Prophylaxis  :  Lovenox -   Lab Results  Component Value Date   PLT 359 06/26/2019    Antibiotics  :    Anti-infectives (From admission, onward)   Start     Dose/Rate Route Frequency Ordered Stop   06/26/19 1000  remdesivir 100 mg in sodium chloride 0.9 % 250 mL IVPB     100 mg 500 mL/hr over 30 Minutes Intravenous Every 24 hours 06/25/19 0825 06/30/19 0959   06/25/19 0930  remdesivir 200 mg in sodium chloride 0.9 % 250 mL IVPB     200 mg 500 mL/hr over 30 Minutes Intravenous Once 06/25/19 0825 06/25/19 1950   06/25/19 0200  azithromycin (ZITHROMAX) 500 mg in sodium chloride 0.9 % 250 mL IVPB  Status:  Discontinued     500 mg 250 mL/hr over 60 Minutes Intravenous Every 24 hours 06/25/19 0158 06/25/19 0908        Objective:   Vitals:   06/25/19 2015 06/25/19 2040 06/25/19 2313 06/26/19 0334  BP: 135/85 134/85 131/82 126/84  Pulse: 86 75    Resp: (!) 29 (!) 34 (!) 29 20  Temp:   97.6 F (36.4 C) 98.1 F (36.7 C)  TempSrc:   Oral Oral  SpO2: 95% 96% 96% 92%  Weight:      Height:        Wt Readings from Last 3  Encounters:  06/25/19 88.5 kg     Intake/Output Summary (Last 24 hours) at 06/26/2019 0937 Last data filed at 06/26/2019 0500 Gross per 24 hour  Intake 645 ml  Output 1550 ml  Net -905 ml    Physical exam Fatigue, tachypneic on minimal exertion, HEENT: Moist mucosa, supple neck Chest: Clear bilaterally CVs: S1-S2 normal, no murmurs GI: Soft, nondistended, nontender Musculoskeletal: Warm, no edema      Data Review:    CBC Recent Labs  Lab 06/24/19 1756 06/25/19 0223 06/25/19 0500 06/26/19 0453  WBC 9.6 9.6 9.9 8.2  HGB 13.9 13.3 12.5* 14.1  HCT 44.9 41.7 39.0 43.4  PLT 246 280 277 359  MCV 85.5 82.9 83.3 82.0  MCH 26.5 26.4 26.7 26.7  MCHC 31.0 31.9 32.1 32.5  RDW 13.2 13.2 13.3 13.2  LYMPHSABS 1.7  --  1.4 1.8  MONOABS 0.5  --  0.4 0.2  EOSABS 0.0  --  0.0 0.0  BASOSABS 0.0  --  0.0 0.0    Chemistries  Recent Labs  Lab 06/24/19 1755 06/25/19 0223 06/25/19 0500 06/26/19 0453  NA 133*  --  135 139  K 3.8  --  3.4* 4.2  CL 96*  --  98 100  CO2 23  --  25 28  GLUCOSE 114*  --  114* 134*  BUN 10  --  9 12  CREATININE 1.43* 1.27* 1.26* 0.97  CALCIUM 8.3*  --  8.1* 8.7*  MG  --   --  2.2 2.7*  AST  --   --  39 39  ALT  --   --  24 29  ALKPHOS  --   --  41 49  BILITOT  --   --  0.8 0.8   ------------------------------------------------------------------------------------------------------------------ Recent Labs    06/25/19 0500 06/26/19 0453  TRIG 81 73    No results found for: HGBA1C ------------------------------------------------------------------------------------------------------------------ No results for input(s): TSH, T4TOTAL, T3FREE, THYROIDAB in the last 72 hours.  Invalid input(s): FREET3 ------------------------------------------------------------------------------------------------------------------ Recent Labs    06/25/19 0500 06/26/19 0453  FERRITIN 290 399*    Coagulation profile No results for input(s): INR, PROTIME  in the last 168 hours.  Recent Labs    06/25/19 0500 06/26/19 0453  DDIMER 1.13* 1.12*    Cardiac Enzymes No results for input(s): CKMB, TROPONINI, MYOGLOBIN in the last 168 hours.  Invalid input(s): CK ------------------------------------------------------------------------------------------------------------------ No results found for: BNP  Inpatient Medications  Scheduled Meds: . aspirin EC  81 mg Oral Daily  . dexamethasone  6 mg Oral Daily  . enoxaparin (LOVENOX) injection  40 mg Subcutaneous Q24H   Continuous Infusions: . remdesivir 100 mg in NS 250 mL     PRN Meds:.acetaminophen  Micro Results Recent Results (from the past 240 hour(s))  SARS Coronavirus 2 (CEPHEID- Performed in [facility] hospital lab), Hosp Order     Status: Abnormal   Collection Time: 06/24/19  6:04 PM   Specimen: Nasopharyngeal Swab  Result Value Ref Range Status   SARS Coronavirus 2 POSITIVE (A) NEGATIVE Final    Comment: RESULT CALLED TO, READ BACK BY AND VERIFIED WITH: A BANKS RN 06/24/19 2018 JDW (NOTE) If result is NEGATIVE SARS-CoV-2 target nucleic acids are NOT DETECTED. The SARS-CoV-2 RNA is generally detectable in upper and lower  respiratory specimens during the acute phase of infection. The lowest  concentration of SARS-CoV-2 viral copies this assay can detect is 250  copies / mL. A negative result does not preclude SARS-CoV-2 infection  and should not be used as the sole basis for treatment or other  patient management decisions.  A negative result may occur with  improper specimen collection / handling, submission of specimen other  than nasopharyngeal swab, presence of viral mutation(s) within the  areas targeted by this assay, and inadequate number of viral copies  (<250 copies / mL). A negative result must be combined with clinical  observations, patient history, and epidemiological information. If result is POSITIVE SARS-CoV-2 target nucleic acids are DETECTED. The SARS-  CoV-2 RNA is generally detectable in upper and lower  respiratory specimens during the acute phase of infection.  Positive  results are indicative of active infection with SARS-CoV-2.  Clinical  correlation with patient history and other diagnostic information is  necessary to determine patient infection status.  Positive results do  not rule out bacterial infection or co-infection with other viruses. If result is PRESUMPTIVE POSTIVE SARS-CoV-2 nucleic acids MAY BE PRESENT.   A presumptive positive result was obtained on the submitted specimen  and confirmed on repeat testing.  While 2019 novel coronavirus  (SARS-CoV-2) nucleic acids may be present in the submitted sample  additional confirmatory testing may be necessary for epidemiological  and / or clinical management purposes  to differentiate between  SARS-CoV-2 and other Sarbecovirus currently known to infect humans.  If clinically indicated additional testing with an alternate test  methodology [phone]) is advised . The SARS-CoV-2 RNA is generally  detectable in upper and lower respiratory specimens during the acute  phase of infection. The expected result is Negative. Fact Sheet  for Patients:  BoilerBrush.com.cyhttps://www.fda.gov/media/136312/download Fact Sheet for Healthcare Providers: https://pope.com/https://www.fda.gov/media/136313/download This test is not yet approved or cleared by the [place] FDA and has been authorized for detection and/or diagnosis of SARS-CoV-2 by FDA under an Emergency Use Authorization (EUA).  This EUA will remain in effect (meaning this test can be used) for the duration of the COVID-19 declaration under Section 564(b)(1) of the Act, 21 U.S.C. section 360bbb-3(b)(1), unless the authorization is terminated or revoked sooner. Performed at [facility], [address] [place], [place] [address]   MRSA PCR Screening     Status: None   Collection Time: 06/25/19 11:32 AM   Specimen: Nasal Mucosa; Nasopharyngeal   Result Value Ref Range Status   MRSA by PCR NEGATIVE NEGATIVE Final    Comment:        The GeneXpert MRSA Assay (FDA approved for NASAL specimens only), is one component of a comprehensive MRSA colonization surveillance program. It is not intended to diagnose MRSA infection nor to guide or monitor treatment for MRSA infections. Performed at [facility], [address] [place], [place] [address]     Radiology Reports Dg Chest Portable 1 View  Result Date: 06/24/2019 CLINICAL DATA:  53 year old male with shortness of breath and cough. EXAM: PORTABLE CHEST 1 VIEW COMPARISON:  None. FINDINGS: Patchy bilateral hazy densities primarily involving the mid to lower lung field noted which although may be related to atelectatic changes are concerning for developing infiltrate. Clinical correlation is recommended. No pleural effusion or pneumothorax. The cardiac silhouette is within normal limits. No acute osseous pathology. IMPRESSION: Bilateral hazy densities concerning for developing infiltrate. Electronically Signed   By: Elgie CollardArash  Radparvar M.D.   On: 06/24/2019 19:15    Time Spent in minutes 35   Elsye Mccollister M.D on 06/26/2019 at 9:37 AM  Between 7am to 7pm - Pager - [phone]  After 7pm go to [email or web address] - password [email or web address]  [facility] -  Office  [phone]

## 2019-06-26 NOTE — Progress Notes (Signed)
Pt [facility] with [facility] for transfer to [facility].

## 2019-06-26 NOTE — Progress Notes (Signed)
Encouraged pt to get up to chair and lay on his belly. Pt verbalizes understanding.

## 2019-06-27 LAB — CBC WITH DIFFERENTIAL/PLATELET
Abs Immature Granulocytes: 0.05 10*3/uL (ref 0.00–0.07)
Basophils Absolute: 0 10*3/uL (ref 0.0–0.1)
Basophils Relative: 0 %
Eosinophils Absolute: 0 10*3/uL (ref 0.0–0.5)
Eosinophils Relative: 0 %
HCT: 42.1 % (ref 39.0–52.0)
Hemoglobin: 13.2 g/dL (ref 13.0–17.0)
Immature Granulocytes: 1 %
Lymphocytes Relative: 14 %
Lymphs Abs: 1.4 10*3/uL (ref 0.7–4.0)
MCH: 26 pg (ref 26.0–34.0)
MCHC: 31.4 g/dL (ref 30.0–36.0)
MCV: 82.9 fL (ref 80.0–100.0)
Monocytes Absolute: 0.4 10*3/uL (ref 0.1–1.0)
Monocytes Relative: 4 %
Neutro Abs: 8.6 10*3/uL — ABNORMAL HIGH (ref 1.7–7.7)
Neutrophils Relative %: 81 %
Platelets: 423 10*3/uL — ABNORMAL HIGH (ref 150–400)
RBC: 5.08 MIL/uL (ref 4.22–5.81)
RDW: 13.2 % (ref 11.5–15.5)
WBC: 10.5 10*3/uL (ref 4.0–10.5)
nRBC: 0 % (ref 0.0–0.2)

## 2019-06-27 LAB — COMPREHENSIVE METABOLIC PANEL WITH GFR
ALT: 32 U/L (ref 0–44)
AST: 40 U/L (ref 15–41)
Albumin: 3.2 g/dL — ABNORMAL LOW (ref 3.5–5.0)
Alkaline Phosphatase: 47 U/L (ref 38–126)
Anion gap: 12 (ref 5–15)
BUN: 24 mg/dL — ABNORMAL HIGH (ref 6–20)
CO2: 26 mmol/L (ref 22–32)
Calcium: 8.7 mg/dL — ABNORMAL LOW (ref 8.9–10.3)
Chloride: 101 mmol/L (ref 98–111)
Creatinine, Ser: 1 mg/dL (ref 0.61–1.24)
GFR calc Af Amer: >60 mL/min (ref >60)
GFR calc non Af Amer: >60 mL/min (ref >60)
Glucose, Bld: 140 mg/dL — ABNORMAL HIGH (ref 70–99)
Potassium: 3.9 mmol/L (ref 3.5–5.1)
Sodium: 139 mmol/L (ref 135–145)
Total Bilirubin: 0.7 mg/dL (ref 0.3–1.2)
Total Protein: 7.8 g/dL (ref 6.5–8.1)

## 2019-06-27 LAB — D-DIMER, QUANTITATIVE: D-Dimer, Quant: 0.92 ug/mL-FEU — ABNORMAL HIGH (ref 0.00–0.50)

## 2019-06-27 LAB — BRAIN NATRIURETIC PEPTIDE: B Natriuretic Peptide: 32.7 pg/mL (ref 0.0–100.0)

## 2019-06-27 LAB — FERRITIN: Ferritin: 374 ng/mL — ABNORMAL HIGH (ref 24–336)

## 2019-06-27 LAB — C-REACTIVE PROTEIN: CRP: 11.7 mg/dL — ABNORMAL HIGH (ref <1.0)

## 2019-06-27 MED ORDER — HYDROCOD POLST-CPM POLST ER 10-8 MG/5ML PO SUER
5.0000 mL | Freq: Two times a day (BID) | ORAL | Status: DC | PRN
  Administered 2019-06-28: 5 mL via ORAL
  Filled 2019-06-27: qty 5

## 2019-06-27 MED ORDER — BENZONATATE 100 MG PO CAPS
200.0000 mg | ORAL_CAPSULE | Freq: Three times a day (TID) | ORAL | Status: DC
  Administered 2019-06-27 – 2019-06-29 (×7): 200 mg via ORAL
  Filled 2019-06-27 (×7): qty 2

## 2019-06-27 NOTE — Progress Notes (Signed)
Patient does not want to call and update family.  He states he has multiple friends and family and he will keep them updated.  Earleen Reaper RN

## 2019-06-27 NOTE — Progress Notes (Addendum)
PROGRESS NOTE                                                                                                                                                                                                             Patient Demographics:    Thomas Saunders, is a 53 y.o.Thomas Saunders male, DOB - 1966-03-12, LOV:[id number]  Outpatient Primary MD for the patient is Patient, No Pcp Per    LOS - 3  Chief Complaint  Patient presents with  . Shortness of Breath       Brief Narrative: Patient is a 53 y.o. male with no PMHx-presented with 1 week history of fever, shortness of breath, cough-found to have sepsis with acute hypoxic respiratory failure in the setting of COVID-19 and pneumonia. Hospital course complicated by worsening hypoxemia (initially on just 3 L of oxygen)-but subsequently required up to 5-6 L.    Subjective:    Thomas Custarderry Vantuyl today feels much better-less cough and less shortness of breath.  He has been afebrile.  He was on 4 L of oxygen when I walked into the room-I titrated him down to 2 L and then to room air-he maintained his O2 saturations in the 90s persistently.   Assessment  & Plan :    Acute Hypoxic Resp Failure with sepsis due to Covid 19 Viral pneumonia: Significantly improved overnight-as noted above-I was able to titrate him to room air while I was in the room this morning-O2 saturation stayed in the 90s throughout the time I was in the room.  Seems to have improved markedly after second dose of Actemra on 7/26.  Plan is to continue Solu-Medrol and complete 5 days of Remdesivir.  Have encouraged patient to use incentive spirometry, continue to keep prone to 3 hours at a time up to 16 hours a day.  Continue supportive care.  COVID-19 Labs: Recent Labs    06/25/19 0500 06/26/19 0453 06/27/19 0210  DDIMER 1.13* 1.12* 0.92*  FERRITIN 290 399* 374*  CRP 20.7* 23.0* 11.7*    COVID-19 Medications: Steroids: Decadron  7/24>> 7/25, Solu-Medrol 7/25>> Remdesivir: 7/24>> Actemra: 7/24 x 1, 7/25 x 1 Convalescent Plasma: N/A  AKI: Hemodynamically mediated secondary to sepsis-resolved with supportive care  ABG: No results found for: PHART, PCO2ART, PO2ART, HCO3, TCO2, ACIDBASEDEF, O2SAT  Condition -Stable  Family Communication  : Spoke with patient's  mother over the phone   Code Status :  Full Code  Diet :  Diet Order            Diet regular Room service appropriate? Yes; Fluid consistency: Thin  Diet effective now               Disposition Plan  :  Remain inpatient  Consults  : None  Procedures  : None  DVT Prophylaxis  :  Lovenox   Lab Results  Component Value Date   PLT 423 (H) 06/27/2019    Inpatient Medications  Scheduled Meds: . aspirin EC  81 mg Oral Daily  . enoxaparin (LOVENOX) injection  40 mg Subcutaneous Q24H  . methylPREDNISolone (SOLU-MEDROL) injection  40 mg Intravenous Q12H   Continuous Infusions: . remdesivir 100 mg in NS 250 mL Stopped (06/26/19 1036)   PRN Meds:.acetaminophen, albuterol, benzonatate, ondansetron (ZOFRAN) IV, polyethylene glycol  Antibiotics  :    Anti-infectives (From admission, onward)   Start     Dose/Rate Route Frequency Ordered Stop   06/26/19 1000  remdesivir 100 mg in sodium chloride 0.9 % 250 mL IVPB     100 mg 500 mL/hr over 30 Minutes Intravenous Every 24 hours 06/25/19 0825 06/30/19 0959   06/25/19 0930  remdesivir 200 mg in sodium chloride 0.9 % 250 mL IVPB     200 mg 500 mL/hr over 30 Minutes Intravenous Once 06/25/19 0825 06/25/19 1950   06/25/19 0200  azithromycin (ZITHROMAX) 500 mg in sodium chloride 0.9 % 250 mL IVPB  Status:  Discontinued     500 mg 250 mL/hr over 60 Minutes Intravenous Every 24 hours 06/25/19 0158 06/25/19 0908       Time Spent in minutes  25   Oren Binet M.D on 06/27/2019 at 9:57 AM  To page go to [email or web address] - use universal password  [facility] -  Office  [phone]    Admit date - 06/24/2019    3    Objective:   Vitals:   06/27/19 0300 06/27/19 0400 06/27/19 0515 06/27/19 0803  BP:   112/70 120/71  Pulse: 71 67 78 76  Resp: (!) 29 19 20 18   Temp:   97.6 F (36.4 C) 98.1 F (36.7 C)  TempSrc:   Oral Oral  SpO2: 93% 95% 96% 97%  Weight:      Height:        Wt Readings from Last 3 Encounters:  06/25/19 88.5 kg     Intake/Output Summary (Last 24 hours) at 06/27/2019 0957 Last data filed at 06/27/2019 0800 Gross per 24 hour  Intake 830.56 ml  Output 775 ml  Net 55.56 ml     Physical Exam Gen Exam:Alert awake-not in any distress HEENT:atraumatic, normocephalic Chest: B/L clear to auscultation anteriorly CVS:S1S2 regular Abdomen:soft non tender, non distended Extremities:no edema Neurology: Non focal Skin: no rash   Data Review:    CBC Recent Labs  Lab 06/24/19 1756 06/25/19 0223 06/25/19 0500 06/26/19 0453 06/27/19 0210  WBC 9.6 9.6 9.9 8.2 10.5  HGB 13.9 13.3 12.5* 14.1 13.2  HCT 44.9 41.7 39.0 43.4 42.1  PLT 246 280 277 359 423*  MCV 85.5 82.9 83.3 82.0 82.9  MCH 26.5 26.4 26.7 26.7 26.0  MCHC 31.0 31.9 32.1 32.5 31.4  RDW 13.2 13.2 13.3 13.2 13.2  LYMPHSABS 1.7  --  1.4 1.8 1.4  MONOABS 0.5  --  0.4 0.2 0.4  EOSABS 0.0  --  0.0 0.0 0.0  BASOSABS 0.0  --  0.0 0.0 0.0    Chemistries  Recent Labs  Lab 06/24/19 1755 06/25/19 0223 06/25/19 0500 06/26/19 0453 06/27/19 0210  NA 133*  --  135 139 139  K 3.8  --  3.4* 4.2 3.9  CL 96*  --  98 100 101  CO2 23  --  25 28 26   GLUCOSE 114*  --  114* 134* 140*  BUN 10  --  9 12 24*  CREATININE 1.43* 1.27* 1.26* 0.97 1.00  CALCIUM 8.3*  --  8.1* 8.7* 8.7*  MG  --   --  2.2 2.7*  --   AST  --   --  39 39 40  ALT  --   --  24 29 32  ALKPHOS  --   --  41 49 47  BILITOT  --   --  0.8 0.8 0.7   ------------------------------------------------------------------------------------------------------------------ Recent Labs    06/25/19 0500 06/26/19 0453  TRIG 81 73     No results found for: HGBA1C ------------------------------------------------------------------------------------------------------------------ No results for input(s): TSH, T4TOTAL, T3FREE, THYROIDAB in the last 72 hours.  Invalid input(s): FREET3 ------------------------------------------------------------------------------------------------------------------ Recent Labs    06/26/19 0453 06/27/19 0210  FERRITIN 399* 374*    Coagulation profile No results for input(s): INR, PROTIME in the last 168 hours.  Recent Labs    06/26/19 0453 06/27/19 0210  DDIMER 1.12* 0.92*    Cardiac Enzymes No results for input(s): CKMB, TROPONINI, MYOGLOBIN in the last 168 hours.  Invalid input(s): CK ------------------------------------------------------------------------------------------------------------------    Component Value Date/Time   BNP 32.7 06/27/2019 0210    Micro Results Recent Results (from the past 240 hour(s))  SARS Coronavirus 2 (CEPHEID- Performed in [facility] hospital lab), Hosp Order     Status: Abnormal   Collection Time: 06/24/19  6:04 PM   Specimen: Nasopharyngeal Swab  Result Value Ref Range Status   SARS Coronavirus 2 POSITIVE (A) NEGATIVE Final    Comment: RESULT CALLED TO, READ BACK BY AND VERIFIED WITH: A BANKS RN 06/24/19 2018 JDW (NOTE) If result is NEGATIVE SARS-CoV-2 target nucleic acids are NOT DETECTED. The SARS-CoV-2 RNA is generally detectable in upper and lower  respiratory specimens during the acute phase of infection. The lowest  concentration of SARS-CoV-2 viral copies this assay can detect is 250  copies / mL. A negative result does not preclude SARS-CoV-2 infection  and should not be used as the sole basis for treatment or other  patient management decisions.  A negative result may occur with  improper specimen collection / handling, submission of specimen other  than nasopharyngeal swab, presence of viral mutation(s) within the   areas targeted by this assay, and inadequate number of viral copies  (<250 copies / mL). A negative result must be combined with clinical  observations, patient history, and epidemiological information. If result is POSITIVE SARS-CoV-2 target nucleic acids are DETECTED. The SARS- CoV-2 RNA is generally detectable in upper and lower  respiratory specimens during the acute phase of infection.  Positive  results are indicative of active infection with SARS-CoV-2.  Clinical  correlation with patient history and other diagnostic information is  necessary to determine patient infection status.  Positive results do  not rule out bacterial infection or co-infection with other viruses. If result is PRESUMPTIVE POSTIVE SARS-CoV-2 nucleic acids MAY BE PRESENT.   A presumptive positive result was obtained on the submitted specimen  and confirmed on repeat testing.  While 2019 novel coronavirus  (SARS-CoV-2) nucleic acids may be present in  the submitted sample  additional confirmatory testing may be necessary for epidemiological  and / or clinical management purposes  to differentiate between  SARS-CoV-2 and other Sarbecovirus currently known to infect humans.  If clinically indicated additional testing with an alternate test  methodology [phone]([id number]) is advised . The SARS-CoV-2 RNA is generally  detectable in upper and lower respiratory specimens during the acute  phase of infection. The expected result is Negative. Fact Sheet for Patients:  BoilerBrush.com.cyhttps://www.fda.gov/media/136312/download Fact Sheet for Healthcare Providers: https://pope.com/https://www.fda.gov/media/136313/download This test is not yet approved or cleared by the [place] FDA and has been authorized for detection and/or diagnosis of SARS-CoV-2 by FDA under an Emergency Use Authorization (EUA).  This EUA will remain in effect (meaning this test can be used) for the duration of the COVID-19 declaration under Section 564(b)(1) of the Act, 21 U.S.C.  section 360bbb-3(b)(1), unless the authorization is terminated or revoked sooner. Performed at [facility], [address] [place], [place] [address]   MRSA PCR Screening     Status: None   Collection Time: 06/25/19 11:32 AM   Specimen: Nasal Mucosa; Nasopharyngeal  Result Value Ref Range Status   MRSA by PCR NEGATIVE NEGATIVE Final    Comment:        The GeneXpert MRSA Assay (FDA approved for NASAL specimens only), is one component of a comprehensive MRSA colonization surveillance program. It is not intended to diagnose MRSA infection nor to guide or monitor treatment for MRSA infections. Performed at [facility], [address] [place], [place] [address]     Radiology Reports Dg Chest Portable 1 View  Result Date: 06/24/2019 CLINICAL DATA:  53 year old male with shortness of breath and cough. EXAM: PORTABLE CHEST 1 VIEW COMPARISON:  None. FINDINGS: Patchy bilateral hazy densities primarily involving the mid to lower lung field noted which although may be related to atelectatic changes are concerning for developing infiltrate. Clinical correlation is recommended. No pleural effusion or pneumothorax. The cardiac silhouette is within normal limits. No acute osseous pathology. IMPRESSION: Bilateral hazy densities concerning for developing infiltrate. Electronically Signed   By: Elgie CollardArash  Radparvar M.D.   On: 06/24/2019 19:15

## 2019-06-28 DIAGNOSIS — R6520 Severe sepsis without septic shock: Secondary | ICD-10-CM

## 2019-06-28 DIAGNOSIS — A419 Sepsis, unspecified organism: Secondary | ICD-10-CM

## 2019-06-28 LAB — FERRITIN: Ferritin: 304 ng/mL (ref 24–336)

## 2019-06-28 LAB — CBC WITH DIFFERENTIAL/PLATELET
Abs Immature Granulocytes: 0.11 10*3/uL — ABNORMAL HIGH (ref 0.00–0.07)
Basophils Absolute: 0 10*3/uL (ref 0.0–0.1)
Basophils Relative: 0 %
Eosinophils Absolute: 0 10*3/uL (ref 0.0–0.5)
Eosinophils Relative: 0 %
HCT: 42.2 % (ref 39.0–52.0)
Hemoglobin: 13.1 g/dL (ref 13.0–17.0)
Immature Granulocytes: 1 %
Lymphocytes Relative: 12 %
Lymphs Abs: 1.4 10*3/uL (ref 0.7–4.0)
MCH: 25.9 pg — ABNORMAL LOW (ref 26.0–34.0)
MCHC: 31 g/dL (ref 30.0–36.0)
MCV: 83.6 fL (ref 80.0–100.0)
Monocytes Absolute: 0.5 10*3/uL (ref 0.1–1.0)
Monocytes Relative: 4 %
Neutro Abs: 10.3 10*3/uL — ABNORMAL HIGH (ref 1.7–7.7)
Neutrophils Relative %: 83 %
Platelets: 504 10*3/uL — ABNORMAL HIGH (ref 150–400)
RBC: 5.05 MIL/uL (ref 4.22–5.81)
RDW: 13.2 % (ref 11.5–15.5)
WBC: 12.3 10*3/uL — ABNORMAL HIGH (ref 4.0–10.5)
nRBC: 0 % (ref 0.0–0.2)

## 2019-06-28 LAB — COMPREHENSIVE METABOLIC PANEL
ALT: 39 U/L (ref 0–44)
AST: 42 U/L — ABNORMAL HIGH (ref 15–41)
Albumin: 2.8 g/dL — ABNORMAL LOW (ref 3.5–5.0)
Alkaline Phosphatase: 47 U/L (ref 38–126)
Anion gap: 11 (ref 5–15)
BUN: 25 mg/dL — ABNORMAL HIGH (ref 6–20)
CO2: 28 mmol/L (ref 22–32)
Calcium: 8.6 mg/dL — ABNORMAL LOW (ref 8.9–10.3)
Chloride: 102 mmol/L (ref 98–111)
Creatinine, Ser: 1.13 mg/dL (ref 0.61–1.24)
GFR calc Af Amer: >60 mL/min (ref >60)
GFR calc non Af Amer: >60 mL/min (ref >60)
Glucose, Bld: 125 mg/dL — ABNORMAL HIGH (ref 70–99)
Potassium: 4.5 mmol/L (ref 3.5–5.1)
Sodium: 141 mmol/L (ref 135–145)
Total Bilirubin: 0.7 mg/dL (ref 0.3–1.2)
Total Protein: 7 g/dL (ref 6.5–8.1)

## 2019-06-28 LAB — INTERLEUKIN-6, PLASMA
Interleukin-6, Plasma: 238.6 pg/mL — ABNORMAL HIGH (ref 0.0–12.2)
Interleukin-6, Plasma: 120.4 pg/mL — ABNORMAL HIGH (ref 0.0–12.2)

## 2019-06-28 LAB — C-REACTIVE PROTEIN: CRP: 5.5 mg/dL — ABNORMAL HIGH (ref <1.0)

## 2019-06-28 LAB — D-DIMER, QUANTITATIVE: D-Dimer, Quant: 0.83 ug/mL-FEU — ABNORMAL HIGH (ref 0.00–0.50)

## 2019-06-28 NOTE — Progress Notes (Addendum)
PROGRESS NOTE                                                                                                                                                                                                             Patient Demographics:    Thomas Saunders, is a 53 y.o. male, DOB - [birth year], ZOX:[id number]  Outpatient Primary MD for the patient is Patient, No Pcp Per    LOS - 4  Chief Complaint  Patient presents with  . Shortness of Breath       Brief Narrative: Patient is a 53 y.o. male with no PMHx-presented with 1 week history of fever, shortness of breath, cough-found to have sepsis with acute hypoxic respiratory failure in the setting of COVID-19 and pneumonia. Hospital course complicated by worsening hypoxemia (initially on just 3 L of oxygen)-but subsequently required up to 5-6 L.    Subjective:    Thomas Saunders feels much better-on room air this morning.   Assessment  & Plan :    Acute Hypoxic Resp Failure with sepsis due to Covid 19 Viral pneumonia: Significantly improved-remains on room air this morning.  Remarkable response to Actemra, steroids and Remdesivir.  If clinical improvement continues-he will likely go home on 7/28 after he finishes his last dose of Remdesivir.  Continue to encourage incentive spirometry and other supportive care.   COVID-19 Labs: Recent Labs    06/26/19 0453 06/27/19 0210 06/28/19 0555  DDIMER 1.12* 0.92* 0.83*  FERRITIN 399* 374* 304  CRP 23.0* 11.7* 5.5*    COVID-19 Medications: Steroids: Decadron 7/24>> 7/25, Solu-Medrol 7/25>> Remdesivir: 7/24>> Actemra: 7/24 x 1, 7/25 x 1 Convalescent Plasma: N/A  AKI: Hemodynamically mediated secondary to sepsis-resolved with supportive care  ABG: No results found for: PHART, PCO2ART, PO2ART, HCO3, TCO2, ACIDBASEDEF, O2SAT  Condition -Stable  Family Communication  : Spoke with patient's mother over the phone   Code Status  :  Full Code  Diet :  Diet Order            Diet regular Room service appropriate? Yes; Fluid consistency: Thin  Diet effective now               Disposition Plan  :  Remain inpatient-home 7/28  Consults  : None  Procedures  : None  DVT Prophylaxis  :  Lovenox  Lab Results  Component Value Date   PLT 504 (H) 06/28/2019    Inpatient Medications  Scheduled Meds: . aspirin EC  81 mg Oral Daily  . benzonatate  200 mg Oral TID  . enoxaparin (LOVENOX) injection  40 mg Subcutaneous Q24H  . methylPREDNISolone (SOLU-MEDROL) injection  40 mg Intravenous Q12H   Continuous Infusions: . remdesivir 100 mg in NS 250 mL 100 mg (06/28/19 0952)   PRN Meds:.acetaminophen, albuterol, chlorpheniramine-HYDROcodone, ondansetron (ZOFRAN) IV, polyethylene glycol  Antibiotics  :    Anti-infectives (From admission, onward)   Start     Dose/Rate Route Frequency Ordered Stop   06/26/19 1000  remdesivir 100 mg in sodium chloride 0.9 % 250 mL IVPB     100 mg 500 mL/hr over 30 Minutes Intravenous Every 24 hours 06/25/19 0825 06/30/19 0959   06/25/19 0930  remdesivir 200 mg in sodium chloride 0.9 % 250 mL IVPB     200 mg 500 mL/hr over 30 Minutes Intravenous Once 06/25/19 0825 06/25/19 1950   06/25/19 0200  azithromycin (ZITHROMAX) 500 mg in sodium chloride 0.9 % 250 mL IVPB  Status:  Discontinued     500 mg 250 mL/hr over 60 Minutes Intravenous Every 24 hours 06/25/19 0158 06/25/19 0908       Time Spent in minutes  25   Jeoffrey MassedShanker Jhene Westmoreland M.D on 06/28/2019 at 11:05 AM  To page go to [email or web address] - use universal password  [facility] -  Office  [phone]   Admit date - 06/24/2019    4    Objective:   Vitals:   06/28/19 0500 06/28/19 0600 06/28/19 0700 06/28/19 0836  BP:    114/78  Pulse: (!) 50 (!) 51 (!) 50 86  Resp: (!) 27 (!) 30 (!) 26   Temp:    98.5 F (36.9 C)  TempSrc:    Oral  SpO2: 92% 92% 92% 91%  Weight:      Height:        Wt Readings from Last 3  Encounters:  06/25/19 88.5 kg     Intake/Output Summary (Last 24 hours) at 06/28/2019 1105 Last data filed at 06/28/2019 0600 Gross per 24 hour  Intake 720 ml  Output 1075 ml  Net -355 ml    Physical Exam Gen Exam:Alert awake-not in any distress HEENT:atraumatic, normocephalic Chest: B/L clear to auscultation anteriorly CVS:S1S2 regular Abdomen:soft non tender, non distended Extremities:no edema Neurology: Non focal Skin: no rash   Data Review:    CBC Recent Labs  Lab 06/24/19 1756 06/25/19 0223 06/25/19 0500 06/26/19 0453 06/27/19 0210 06/28/19 0555  WBC 9.6 9.6 9.9 8.2 10.5 12.3*  HGB 13.9 13.3 12.5* 14.1 13.2 13.1  HCT 44.9 41.7 39.0 43.4 42.1 42.2  PLT 246 280 277 359 423* 504*  MCV 85.5 82.9 83.3 82.0 82.9 83.6  MCH 26.5 26.4 26.7 26.7 26.0 25.9*  MCHC 31.0 31.9 32.1 32.5 31.4 31.0  RDW 13.2 13.2 13.3 13.2 13.2 13.2  LYMPHSABS 1.7  --  1.4 1.8 1.4 1.4  MONOABS 0.5  --  0.4 0.2 0.4 0.5  EOSABS 0.0  --  0.0 0.0 0.0 0.0  BASOSABS 0.0  --  0.0 0.0 0.0 0.0    Chemistries  Recent Labs  Lab 06/24/19 1755 06/25/19 0223 06/25/19 0500 06/26/19 0453 06/27/19 0210 06/28/19 0555  NA 133*  --  135 139 139 141  K 3.8  --  3.4* 4.2 3.9 4.5  CL 96*  --  98 100 101 102  CO2 23  --  25 28 26 28   GLUCOSE 114*  --  114* 134* 140* 125*  BUN 10  --  9 12 24* 25*  CREATININE 1.43* 1.27* 1.26* 0.97 1.00 1.13  CALCIUM 8.3*  --  8.1* 8.7* 8.7* 8.6*  MG  --   --  2.2 2.7*  --   --   AST  --   --  39 39 40 42*  ALT  --   --  24 29 32 39  ALKPHOS  --   --  41 49 47 47  BILITOT  --   --  0.8 0.8 0.7 0.7   ------------------------------------------------------------------------------------------------------------------ Recent Labs    06/26/19 0453  TRIG 73    No results found for: HGBA1C ------------------------------------------------------------------------------------------------------------------ No results for input(s): TSH, T4TOTAL, T3FREE, THYROIDAB in the  last 72 hours.  Invalid input(s): FREET3 ------------------------------------------------------------------------------------------------------------------ Recent Labs    06/27/19 0210 06/28/19 0555  FERRITIN 374* 304    Coagulation profile No results for input(s): INR, PROTIME in the last 168 hours.  Recent Labs    06/27/19 0210 06/28/19 0555  DDIMER 0.92* 0.83*    Cardiac Enzymes No results for input(s): CKMB, TROPONINI, MYOGLOBIN in the last 168 hours.  Invalid input(s): CK ------------------------------------------------------------------------------------------------------------------    Component Value Date/Time   BNP 32.7 06/27/2019 0210    Micro Results Recent Results (from the past 240 hour(s))  SARS Coronavirus 2 (CEPHEID- Performed [facility]), Hosp Order     Status: Abnormal   Collection Time: 06/24/19  6:04 PM   Specimen: Nasopharyngeal Swab  Result Value Ref Range Status   SARS Coronavirus 2 POSITIVE (A) NEGATIVE Final    Comment: RESULT CALLED TO, READ BACK BY AND VERIFIED WITH: A BANKS RN 06/24/19 2018 JDW (NOTE) If result is NEGATIVE SARS-CoV-2 target nucleic acids are NOT DETECTED. The SARS-CoV-2 RNA is generally detectable in upper and lower  respiratory specimens during the acute phase of infection. The lowest  concentration of SARS-CoV-2 viral copies this assay can detect is 250  copies / mL. A negative result does not preclude SARS-CoV-2 infection  and should not be used as the sole basis for treatment or other  patient management decisions.  A negative result may occur with  improper specimen collection / handling, submission of specimen other  than nasopharyngeal swab, presence of viral mutation(s) within the  areas targeted by this assay, and inadequate number of viral copies  (<250 copies / mL). A negative result must be combined with clinical  observations, patient history, and epidemiological information. If result is  POSITIVE SARS-CoV-2 target nucleic acids are DETECTED. The SARS- CoV-2 RNA is generally detectable in upper and lower  respiratory specimens during the acute phase of infection.  Positive  results are indicative of active infection with SARS-CoV-2.  Clinical  correlation with patient history and other diagnostic information is  necessary to determine patient infection status.  Positive results do  not rule out bacterial infection or co-infection with other viruses. If result is PRESUMPTIVE POSTIVE SARS-CoV-2 nucleic acids MAY BE PRESENT.   A presumptive positive result was obtained on the submitted specimen  and confirmed on repeat testing.  While 2019 novel coronavirus  (SARS-CoV-2) nucleic acids may be present in the submitted sample  additional confirmatory testing may be necessary for epidemiological  and / or clinical management purposes  to differentiate between  SARS-CoV-2 and other Sarbecovirus currently known to infect humans.  If clinically indicated additional testing with an  alternate test  methodology [phone]) is advised . The SARS-CoV-2 RNA is generally  detectable in upper and lower respiratory specimens during the acute  phase of infection. The expected result is Negative. Fact Sheet for Patients:  StrictlyIdeas.no Fact Sheet for Healthcare Providers: BankingDealers.co.za This test is not yet approved or cleared by the [place] FDA and has been authorized for detection and/or diagnosis of SARS-CoV-2 by FDA under an Emergency Use Authorization (EUA).  This EUA will remain in effect (meaning this test can be used) for the duration of the COVID-19 declaration under Section 564(b)(1) of the Act, 21 U.S.C. section 360bbb-3(b)(1), unless the authorization is terminated or revoked sooner. Performed at [facility], [place] [address] [place], [place] [address]   MRSA PCR Screening     Status: None   Collection Time:  06/25/19 11:32 AM   Specimen: Nasal Mucosa; Nasopharyngeal  Result Value Ref Range Status   MRSA by PCR NEGATIVE NEGATIVE Final    Comment:        The GeneXpert MRSA Assay (FDA approved for NASAL specimens only), is one component of a comprehensive MRSA colonization surveillance program. It is not intended to diagnose MRSA infection nor to guide or monitor treatment for MRSA infections. Performed at [facility], [place] [address] [place], [place] [address]     Radiology Reports Dg Chest Portable 1 View  Result Date: 06/24/2019 CLINICAL DATA:  53 year old male with shortness of breath and cough. EXAM: PORTABLE CHEST 1 VIEW COMPARISON:  None. FINDINGS: Patchy bilateral hazy densities primarily involving the mid to lower lung field noted which although may be related to atelectatic changes are concerning for developing infiltrate. Clinical correlation is recommended. No pleural effusion or pneumothorax. The cardiac silhouette is within normal limits. No acute osseous pathology. IMPRESSION: Bilateral hazy densities concerning for developing infiltrate. Electronically Signed   By: Anner Crete M.D.   On: 06/24/2019 19:15

## 2019-06-29 DIAGNOSIS — U071 COVID-19: Principal | ICD-10-CM

## 2019-06-29 DIAGNOSIS — J1289 Other viral pneumonia: Secondary | ICD-10-CM

## 2019-06-29 DIAGNOSIS — N179 Acute kidney failure, unspecified: Secondary | ICD-10-CM

## 2019-06-29 DIAGNOSIS — J9601 Acute respiratory failure with hypoxia: Secondary | ICD-10-CM

## 2019-06-29 LAB — CBC WITH DIFFERENTIAL/PLATELET
Abs Immature Granulocytes: 0.11 10*3/uL — ABNORMAL HIGH (ref 0.00–0.07)
Basophils Absolute: 0 10*3/uL (ref 0.0–0.1)
Basophils Relative: 0 %
Eosinophils Absolute: 0 10*3/uL (ref 0.0–0.5)
Eosinophils Relative: 0 %
HCT: 39.8 % (ref 39.0–52.0)
Hemoglobin: 12.9 g/dL — ABNORMAL LOW (ref 13.0–17.0)
Immature Granulocytes: 1 %
Lymphocytes Relative: 10 %
Lymphs Abs: 1.3 10*3/uL (ref 0.7–4.0)
MCH: 26.8 pg (ref 26.0–34.0)
MCHC: 32.4 g/dL (ref 30.0–36.0)
MCV: 82.6 fL (ref 80.0–100.0)
Monocytes Absolute: 0.4 10*3/uL (ref 0.1–1.0)
Monocytes Relative: 3 %
Neutro Abs: 11 10*3/uL — ABNORMAL HIGH (ref 1.7–7.7)
Neutrophils Relative %: 86 %
Platelets: 460 10*3/uL — ABNORMAL HIGH (ref 150–400)
RBC: 4.82 MIL/uL (ref 4.22–5.81)
RDW: 13.2 % (ref 11.5–15.5)
WBC: 12.8 10*3/uL — ABNORMAL HIGH (ref 4.0–10.5)
nRBC: 0 % (ref 0.0–0.2)

## 2019-06-29 LAB — COMPREHENSIVE METABOLIC PANEL
ALT: 50 U/L — ABNORMAL HIGH (ref 0–44)
AST: 42 U/L — ABNORMAL HIGH (ref 15–41)
Albumin: 2.7 g/dL — ABNORMAL LOW (ref 3.5–5.0)
Alkaline Phosphatase: 46 U/L (ref 38–126)
Anion gap: 9 (ref 5–15)
BUN: 28 mg/dL — ABNORMAL HIGH (ref 6–20)
CO2: 24 mmol/L (ref 22–32)
Calcium: 8.4 mg/dL — ABNORMAL LOW (ref 8.9–10.3)
Chloride: 106 mmol/L (ref 98–111)
Creatinine, Ser: 1.1 mg/dL (ref 0.61–1.24)
GFR calc Af Amer: >60 mL/min (ref >60)
GFR calc non Af Amer: >60 mL/min (ref >60)
Glucose, Bld: 117 mg/dL — ABNORMAL HIGH (ref 70–99)
Potassium: 4.4 mmol/L (ref 3.5–5.1)
Sodium: 139 mmol/L (ref 135–145)
Total Bilirubin: 0.6 mg/dL (ref 0.3–1.2)
Total Protein: 6.4 g/dL — ABNORMAL LOW (ref 6.5–8.1)

## 2019-06-29 LAB — C-REACTIVE PROTEIN: CRP: 2.9 mg/dL — ABNORMAL HIGH (ref <1.0)

## 2019-06-29 LAB — FERRITIN: Ferritin: 252 ng/mL (ref 24–336)

## 2019-06-29 LAB — D-DIMER, QUANTITATIVE: D-Dimer, Quant: 0.84 ug/mL-FEU — ABNORMAL HIGH (ref 0.00–0.50)

## 2019-06-29 MED ORDER — DEXAMETHASONE 6 MG PO TABS: 6.0000 mg | ORAL_TABLET | Freq: Every day | ORAL | 0 refills | Status: DC

## 2019-06-29 MED ORDER — ALBUTEROL SULFATE HFA 108 (90 BASE) MCG/ACT IN AERS: 2.0000 | INHALATION_SPRAY | RESPIRATORY_TRACT | 0 refills | Status: DC | PRN

## 2019-06-29 MED ORDER — BENZONATATE 200 MG PO CAPS: 200.0000 mg | ORAL_CAPSULE | Freq: Three times a day (TID) | ORAL | 0 refills | Status: DC | PRN

## 2019-06-29 NOTE — Discharge Summary (Signed)
PATIENT DETAILS Name: Thomas Saunders Age: 53 y.o. Sex: male Date of Birth: 1965/12/09 MRN: [id number]. Admitting Physician: No admitting provider for patient encounter. WUJ:[id number], No Pcp Per  Admit Date: 06/24/2019 Discharge date: 06/29/2019  Recommendations for Outpatient Follow-up:  1. Follow up with PCP in 1-2 weeks 2. Please obtain BMP/CBC in one week  Admitted From:  Home  Disposition: Home   Home Health: No  Equipment/Devices: None  Discharge Condition: Stable  CODE STATUS: FULL CODE  Diet recommendation:  Diet Order            Diet general        Diet regular Room service appropriate? Yes; Fluid consistency: Thin  Diet effective now               Brief Summary: See H&P, Labs, Consult and Test reports for all details in brief,Patient is a 53 y.o. male with no PMHx-presented with 1 week history of fever, shortness of breath, cough-found to have sepsis with acute hypoxic respiratory failure in the setting of COVID-19 and pneumonia. Hospital course complicated by worsening hypoxemia (initially on just 3 L of oxygen)-but subsequently required up to 5-6 L.  Patient was subsequently treated with steroids, Remdesivir and Actemra x2 with remarkable clinical improvement.  See below for further details  Brief Hospital Course: Acute Hypoxic Resp Failure with sepsis due to Covid 19 Viral pneumonia: Significantly improved-has been on room air for the past 3 days.  Has had a remarkable response to Actemra x2, steroids and Remdesivir.  He will complete his last dose of Remdesivir today-following which he will be discharged home.   COVID-19 Labs  Recent Labs    06/27/19 0210 06/28/19 0555 06/29/19 0440  DDIMER 0.92* 0.83* 0.84*  FERRITIN 374* 304 252  CRP 11.7* 5.5* 2.9*    Lab Results  Component Value Date   SARSCOV2NAA POSITIVE (A) 06/24/2019    COVID-19 Medications: Steroids: Decadron 7/24>> 7/25, Solu-Medrol 7/25>>7/28 Remdesivir:  7/24>>7/28 Actemra: 7/24 x 1, 7/25 x 1 Convalescent Plasma: N/A  AKI: Hemodynamically mediated secondary to sepsis-resolved with supportive care   Obesity: Estimated body mass index is 30.56 kg/m as calculated from the following:   Height as of this encounter: 5\' 7"  (1.702 m).   Weight as of this encounter: 88.5 kg.    Procedures/Studies: None  Discharge Diagnoses:  Active Problems:   Pneumonia due to severe acute respiratory syndrome coronavirus 2 (SARS-CoV-2)   Acute respiratory failure with hypoxia (HCC)   Severe sepsis (HCC)   AKI (acute kidney injury) (HCC)   Hypokalemia   Discharge Instructions:  Activity:  As tolerated   Discharge Instructions    Call MD for:  difficulty breathing, headache or visual disturbances   Complete by: As directed    Call MD for:  extreme fatigue   Complete by: As directed    Call MD for:  persistant nausea and vomiting   Complete by: As directed    Call MD for:  temperature >100.4   Complete by: As directed    Diet general   Complete by: As directed    Discharge instructions   Complete by: As directed    Follow with Primary MD in 1 week  Please get a complete blood count and chemistry panel checked by your Primary MD at your next visit, and again as instructed by your Primary MD.  Get Medicines reviewed and adjusted: Please take all your medications with you for your next visit with your Primary MD  Laboratory/radiological  data: Please request your Primary MD to go over all hospital tests and procedure/radiological results at the follow up, please ask your Primary MD to get all Hospital records sent to his/her office.  In some cases, they will be blood work, cultures and biopsy results pending at the time of your discharge. Please request that your primary care M.D. follows up on these results.  Also Note the following: If you experience worsening of your admission symptoms, develop shortness of breath, life threatening  emergency, suicidal or homicidal thoughts you must seek medical attention immediately by calling 911 or calling your MD immediately  if symptoms less severe.  You must read complete instructions/literature along with all the possible adverse reactions/side effects for all the Medicines you take and that have been prescribed to you. Take any new Medicines after you have completely understood and accpet all the possible adverse reactions/side effects.   Do not drive when taking Pain medications or sleeping medications (Benzodaizepines)  Do not take more than prescribed Pain, Sleep and Anxiety Medications. It is not advisable to combine anxiety,sleep and pain medications without talking with your primary care practitioner  Special Instructions: If you have smoked or chewed Tobacco  in the last 2 yrs please stop smoking, stop any regular Alcohol  and or any Recreational drug use.  Wear Seat belts while driving.  Please note: You were cared for by a hospitalist during your hospital stay. Once you are discharged, your primary care physician will handle any further medical issues. Please note that NO REFILLS for any discharge medications will be authorized once you are discharged, as it is imperative that you return to your primary care physician (or establish a relationship with a primary care physician if you do not have one) for your post hospital discharge needs so that they can reassess your need for medications and monitor your lab values.   ?   Person Under Monitoring Name: Thomas Saunders  Location: [address] [place] [address]   Infection Prevention Recommendations for Individuals Confirmed to have, or Being Evaluated for, 2019 Novel Coronavirus (COVID-19) Infection Who Receive Care at Home  Individuals who are confirmed to have, or are being evaluated for, COVID-19 should follow the prevention steps below until a healthcare provider or local or state health department says  they can return to normal activities.  Stay home except to get medical care You should restrict activities outside your home, except for getting medical care. Do not go to work, school, or public areas, and do not use public transportation or taxis.  Call ahead before visiting your doctor Before your medical appointment, call the healthcare provider and tell them that you have, or are being evaluated for, COVID-19 infection. This will help the healthcare providers office take steps to keep other people from getting infected. Ask your healthcare provider to call the local or state health department.  Monitor your symptoms Seek prompt medical attention if your illness is worsening (e.g., difficulty breathing). Before going to your medical appointment, call the healthcare provider and tell them that you have, or are being evaluated for, COVID-19 infection. Ask your healthcare provider to call the local or state health department.  Wear a facemask You should wear a facemask that covers your nose and mouth when you are in the same room with other people and when you visit a healthcare provider. People who live with or visit you should also wear a facemask while they are in the same room with you.  Separate yourself from other people in your home As much as possible, you should stay in a different room from other people in your home. Also, you should use a separate bathroom, if available.  Avoid sharing household items You should not share dishes, drinking glasses, cups, eating utensils, towels, bedding, or other items with other people in your home. After using these items, you should wash them thoroughly with soap and water.  Cover your coughs and sneezes Cover your mouth and nose with a tissue when you cough or sneeze, or you can cough or sneeze into your sleeve. Throw used tissues in a lined trash can, and immediately wash your hands with soap and water for at least 20 seconds or use  an alcohol-based hand rub.  Wash your Union Pacific Corporationhands Wash your hands often and thoroughly with soap and water for at least 20 seconds. You can use an alcohol-based hand sanitizer if soap and water are not available and if your hands are not visibly dirty. Avoid touching your eyes, nose, and mouth with unwashed hands.   Prevention Steps for Caregivers and Household Members of Individuals Confirmed to have, or Being Evaluated for, COVID-19 Infection Being Cared for in the Home  If you live with, or provide care at home for, a person confirmed to have, or being evaluated for, COVID-19 infection please follow these guidelines to prevent infection:  Follow healthcare providers instructions Make sure that you understand and can help the patient follow any healthcare provider instructions for all care.  Provide for the patients basic needs You should help the patient with basic needs in the home and provide support for getting groceries, prescriptions, and other personal needs.  Monitor the patients symptoms If they are getting sicker, call his or her medical provider and tell them that the patient has, or is being evaluated for, COVID-19 infection. This will help the healthcare providers office take steps to keep other people from getting infected. Ask the healthcare provider to call the local or state health department.  Limit the number of people who have contact with the patient If possible, have only one caregiver for the patient. Other household members should stay in another home or place of residence. If this is not possible, they should stay in another room, or be separated from the patient as much as possible. Use a separate bathroom, if available. Restrict visitors who do not have an essential need to be in the home.  Keep older adults, very young children, and other sick people away from the patient Keep older adults, very young children, and those who have compromised immune  systems or chronic health conditions away from the patient. This includes people with chronic heart, lung, or kidney conditions, diabetes, and cancer.  Ensure good ventilation Make sure that shared spaces in the home have good air flow, such as from an air conditioner or an opened window, weather permitting.  Wash your hands often Wash your hands often and thoroughly with soap and water for at least 20 seconds. You can use an alcohol based hand sanitizer if soap and water are not available and if your hands are not visibly dirty. Avoid touching your eyes, nose, and mouth with unwashed hands. Use disposable paper towels to dry your hands. If not available, use dedicated cloth towels and replace them when they become wet.  Wear a facemask and gloves Wear a disposable facemask at all times in the room and gloves when you touch or have contact with the patients  blood, body fluids, and/or secretions or excretions, such as sweat, saliva, sputum, nasal mucus, vomit, urine, or feces.  Ensure the mask fits over your nose and mouth tightly, and do not touch it during use. Throw out disposable facemasks and gloves after using them. Do not reuse. Wash your hands immediately after removing your facemask and gloves. If your personal clothing becomes contaminated, carefully remove clothing and launder. Wash your hands after handling contaminated clothing. Place all used disposable facemasks, gloves, and other waste in a lined container before disposing them with other household waste. Remove gloves and wash your hands immediately after handling these items.  Do not share dishes, glasses, or other household items with the patient Avoid sharing household items. You should not share dishes, drinking glasses, cups, eating utensils, towels, bedding, or other items with a patient who is confirmed to have, or being evaluated for, COVID-19 infection. After the person uses these items, you should wash them thoroughly  with soap and water.  Wash laundry thoroughly Immediately remove and wash clothes or bedding that have blood, body fluids, and/or secretions or excretions, such as sweat, saliva, sputum, nasal mucus, vomit, urine, or feces, on them. Wear gloves when handling laundry from the patient. Read and follow directions on labels of laundry or clothing items and detergent. In general, wash and dry with the warmest temperatures recommended on the label.  Clean all areas the individual has used often Clean all touchable surfaces, such as counters, tabletops, doorknobs, bathroom fixtures, toilets, phones, keyboards, tablets, and bedside tables, every day. Also, clean any surfaces that may have blood, body fluids, and/or secretions or excretions on them. Wear gloves when cleaning surfaces the patient has come in contact with. Use a diluted bleach solution (e.g., dilute bleach with 1 part bleach and 10 parts water) or a household disinfectant with a label that says EPA-registered for coronaviruses. To make a bleach solution at home, add 1 tablespoon of bleach to 1 quart (4 cups) of water. For a larger supply, add  cup of bleach to 1 gallon (16 cups) of water. Read labels of cleaning products and follow recommendations provided on product labels. Labels contain instructions for safe and effective use of the cleaning product including precautions you should take when applying the product, such as wearing gloves or eye protection and making sure you have good ventilation during use of the product. Remove gloves and wash hands immediately after cleaning.  Monitor yourself for signs and symptoms of illness Caregivers and household members are considered close contacts, should monitor their health, and will be asked to limit movement outside of the home to the extent possible. Follow the monitoring steps for close contacts listed on the symptom monitoring form.   ? If you have additional questions, contact your  local health department or call the epidemiologist on call at [phone] (available 24/7). ? This guidance is subject to change. For the most up-to-date guidance from [facility], please refer to their website: TripMetro.huhttps://www.cdc.gov/coronavirus/2019-ncov/hcp/guidance-prevent-spread.html   Increase activity slowly   Complete by: As directed      Allergies as of 06/29/2019   No Known Allergies     Medication List    TAKE these medications   albuterol 108 (90 Base) MCG/ACT inhaler Commonly known as: VENTOLIN HFA Inhale 2 puffs into the lungs every 4 (four) hours as needed for wheezing or shortness of breath.   aspirin EC 81 MG tablet Take 81 mg by mouth daily.   benzonatate 200 MG capsule Commonly known as: TESSALON  Take 1 capsule (200 mg total) by mouth 3 (three) times daily as needed for cough.   dexamethasone 6 MG tablet Commonly known as: DECADRON Take 1 tablet (6 mg total) by mouth daily.      Follow-up Information    Primary MD. Schedule an appointment as soon as possible for a visit in 1 week(s).          No Known Allergies   Consultations:   None   Other Procedures/Studies: Dg Chest Portable 1 View  Result Date: 06/24/2019 CLINICAL DATA:  53 year old male with shortness of breath and cough. EXAM: PORTABLE CHEST 1 VIEW COMPARISON:  None. FINDINGS: Patchy bilateral hazy densities primarily involving the mid to lower lung field noted which although may be related to atelectatic changes are concerning for developing infiltrate. Clinical correlation is recommended. No pleural effusion or pneumothorax. The cardiac silhouette is within normal limits. No acute osseous pathology. IMPRESSION: Bilateral hazy densities concerning for developing infiltrate. Electronically Signed   By: Elgie CollardArash  Radparvar M.D.   On: 06/24/2019 19:15      TODAY-DAY OF DISCHARGE:  Subjective:   Thomas Saunders today has no headache,no chest abdominal pain,no new weakness tingling or numbness, feels  much better wants to go home today.  Objective:   Blood pressure (!) 84/57, pulse (!) 50, temperature (!) 97.5 F (36.4 C), temperature source Oral, resp. rate (!) 25, height 5\' 7"  (1.702 m), weight 88.5 kg, SpO2 96 %.  Intake/Output Summary (Last 24 hours) at 06/29/2019 1015 Last data filed at 06/29/2019 0900 Gross per 24 hour  Intake 1440 ml  Output 1025 ml  Net 415 ml   Filed Weights   06/25/19 0810 06/25/19 0811  Weight: 88.5 kg 88.5 kg    Exam: Awake Alert, Oriented *3, No new F.N deficits, Normal affect [place].AT,PERRAL Supple Neck,No JVD, No cervical lymphadenopathy appriciated.  Symmetrical Chest wall movement, Good air movement bilaterally, CTAB RRR,No Gallops,Rubs or new Murmurs, No Parasternal Heave +ve B.Sounds, Abd Soft, Non tender, No organomegaly appriciated, No rebound -guarding or rigidity. No Cyanosis, Clubbing or edema, No new Rash or bruise   PERTINENT RADIOLOGIC STUDIES: Dg Chest Portable 1 View  Result Date: 06/24/2019 CLINICAL DATA:  53 year old male with shortness of breath and cough. EXAM: PORTABLE CHEST 1 VIEW COMPARISON:  None. FINDINGS: Patchy bilateral hazy densities primarily involving the mid to lower lung field noted which although may be related to atelectatic changes are concerning for developing infiltrate. Clinical correlation is recommended. No pleural effusion or pneumothorax. The cardiac silhouette is within normal limits. No acute osseous pathology. IMPRESSION: Bilateral hazy densities concerning for developing infiltrate. Electronically Signed   By: Elgie CollardArash  Radparvar M.D.   On: 06/24/2019 19:15     PERTINENT LAB RESULTS: CBC: Recent Labs    06/28/19 0555 06/29/19 0440  WBC 12.3* 12.8*  HGB 13.1 12.9*  HCT 42.2 39.8  PLT 504* 460*   CMET CMP     Component Value Date/Time   NA 139 06/29/2019 0440   K 4.4 06/29/2019 0440   CL 106 06/29/2019 0440   CO2 24 06/29/2019 0440   GLUCOSE 117 (H) 06/29/2019 0440   BUN 28 (H) 06/29/2019 0440    CREATININE 1.10 06/29/2019 0440   CALCIUM 8.4 (L) 06/29/2019 0440   PROT 6.4 (L) 06/29/2019 0440   ALBUMIN 2.7 (L) 06/29/2019 0440   AST 42 (H) 06/29/2019 0440   ALT 50 (H) 06/29/2019 0440   ALKPHOS 46 06/29/2019 0440   BILITOT 0.6 06/29/2019 0440  GFRNONAA >60 06/29/2019 0440   GFRAA >60 06/29/2019 0440    GFR Estimated Creatinine Clearance: 80.6 mL/min (by C-G formula based on SCr of 1.1 mg/dL). No results for input(s): LIPASE, AMYLASE in the last 72 hours. No results for input(s): CKTOTAL, CKMB, CKMBINDEX, TROPONINI in the last 72 hours. Invalid input(s): POCBNP Recent Labs    06/28/19 0555 06/29/19 0440  DDIMER 0.83* 0.84*   No results for input(s): HGBA1C in the last 72 hours. No results for input(s): CHOL, HDL, LDLCALC, TRIG, CHOLHDL, LDLDIRECT in the last 72 hours. No results for input(s): TSH, T4TOTAL, T3FREE, THYROIDAB in the last 72 hours.  Invalid input(s): FREET3 Recent Labs    06/28/19 0555 06/29/19 0440  FERRITIN 304 252   Coags: No results for input(s): INR in the last 72 hours.  Invalid input(s): PT Microbiology: Recent Results (from the past 240 hour(s))  SARS Coronavirus 2 (CEPHEID- Performed in [facility] hospital lab), Hosp Order     Status: Abnormal   Collection Time: 06/24/19  6:04 PM   Specimen: Nasopharyngeal Swab  Result Value Ref Range Status   SARS Coronavirus 2 POSITIVE (A) NEGATIVE Final    Comment: RESULT CALLED TO, READ BACK BY AND VERIFIED WITH: A BANKS RN 06/24/19 2018 JDW (NOTE) If result is NEGATIVE SARS-CoV-2 target nucleic acids are NOT DETECTED. The SARS-CoV-2 RNA is generally detectable in upper and lower  respiratory specimens during the acute phase of infection. The lowest  concentration of SARS-CoV-2 viral copies this assay can detect is 250  copies / mL. A negative result does not preclude SARS-CoV-2 infection  and should not be used as the sole basis for treatment or other  patient management decisions.  A negative  result may occur with  improper specimen collection / handling, submission of specimen other  than nasopharyngeal swab, presence of viral mutation(s) within the  areas targeted by this assay, and inadequate number of viral copies  (<250 copies / mL). A negative result must be combined with clinical  observations, patient history, and epidemiological information. If result is POSITIVE SARS-CoV-2 target nucleic acids are DETECTED. The SARS- CoV-2 RNA is generally detectable in upper and lower  respiratory specimens during the acute phase of infection.  Positive  results are indicative of active infection with SARS-CoV-2.  Clinical  correlation with patient history and other diagnostic information is  necessary to determine patient infection status.  Positive results do  not rule out bacterial infection or co-infection with other viruses. If result is PRESUMPTIVE POSTIVE SARS-CoV-2 nucleic acids MAY BE PRESENT.   A presumptive positive result was obtained on the submitted specimen  and confirmed on repeat testing.  While 2019 novel coronavirus  (SARS-CoV-2) nucleic acids may be present in the submitted sample  additional confirmatory testing may be necessary for epidemiological  and / or clinical management purposes  to differentiate between  SARS-CoV-2 and other Sarbecovirus currently known to infect humans.  If clinically indicated additional testing with an alternate test  methodology [phone]) is advised . The SARS-CoV-2 RNA is generally  detectable in upper and lower respiratory specimens during the acute  phase of infection. The expected result is Negative. Fact Sheet for Patients:  BoilerBrush.com.cy Fact Sheet for Healthcare Providers: https://pope.com/ This test is not yet approved or cleared by the [place] FDA and has been authorized for detection and/or diagnosis of SARS-CoV-2 by FDA under an Emergency Use Authorization  (EUA).  This EUA will remain in effect (meaning this test can be used) for  the duration of the COVID-19 declaration under Section 564(b)(1) of the Act, 21 U.S.C. section 360bbb-3(b)(1), unless the authorization is terminated or revoked sooner. Performed at [facility], [place] [address] [place], [place] [address]   MRSA PCR Screening     Status: None   Collection Time: 06/25/19 11:32 AM   Specimen: Nasal Mucosa; Nasopharyngeal  Result Value Ref Range Status   MRSA by PCR NEGATIVE NEGATIVE Final    Comment:        The GeneXpert MRSA Assay (FDA approved for NASAL specimens only), is one component of a comprehensive MRSA colonization surveillance program. It is not intended to diagnose MRSA infection nor to guide or monitor treatment for MRSA infections. Performed at [facility], [place] [address] [place], [place] [address]     FURTHER DISCHARGE INSTRUCTIONS:  Get Medicines reviewed and adjusted: Please take all your medications with you for your next visit with your Primary MD  Laboratory/radiological data: Please request your Primary MD to go over all hospital tests and procedure/radiological results at the follow up, please ask your Primary MD to get all Hospital records sent to his/her office.  In some cases, they will be blood work, cultures and biopsy results pending at the time of your discharge. Please request that your primary care M.D. goes through all the records of your hospital data and follows up on these results.  Also Note the following: If you experience worsening of your admission symptoms, develop shortness of breath, life threatening emergency, suicidal or homicidal thoughts you must seek medical attention immediately by calling 911 or calling your MD immediately  if symptoms less severe.  You must read complete instructions/literature along with all the possible adverse reactions/side effects for all the Medicines you take and that have been  prescribed to you. Take any new Medicines after you have completely understood and accpet all the possible adverse reactions/side effects.   Do not drive when taking Pain medications or sleeping medications (Benzodaizepines)  Do not take more than prescribed Pain, Sleep and Anxiety Medications. It is not advisable to combine anxiety,sleep and pain medications without talking with your primary care practitioner  Special Instructions: If you have smoked or chewed Tobacco  in the last 2 yrs please stop smoking, stop any regular Alcohol  and or any Recreational drug use.  Wear Seat belts while driving.  Please note: You were cared for by a hospitalist during your hospital stay. Once you are discharged, your primary care physician will handle any further medical issues. Please note that NO REFILLS for any discharge medications will be authorized once you are discharged, as it is imperative that you return to your primary care physician (or establish a relationship with a primary care physician if you do not have one) for your post hospital discharge needs so that they can reassess your need for medications and monitor your lab values.  Total Time spent coordinating discharge including counseling, education and face to face time equals 25  minutes.  Signed: Aradhya Shellenbarger 06/29/2019 10:15 AM

## 2019-06-29 NOTE — Discharge Instructions (Signed)
Person Under Monitoring Name: Thomas Saunders  Location: [address] [place] [id number]   Infection Prevention Recommendations for Individuals Confirmed to have, or Being Evaluated for, 2019 Novel Coronavirus (COVID-19) Infection Who Receive Care at Home  Individuals who are confirmed to have, or are being evaluated for, COVID-19 should follow the prevention steps below until a healthcare provider or local or state health department says they can return to normal activities.  Stay home except to get medical care You should restrict activities outside your home, except for getting medical care. Do not go to work, school, or public areas, and do not use public transportation or taxis.  Call ahead before visiting your doctor Before your medical appointment, call the healthcare provider and tell them that you have, or are being evaluated for, COVID-19 infection. This will help the healthcare providers office take steps to keep other people from getting infected. Ask your healthcare provider to call the local or state health department.  Monitor your symptoms Seek prompt medical attention if your illness is worsening (e.g., difficulty breathing). Before going to your medical appointment, call the healthcare provider and tell them that you have, or are being evaluated for, COVID-19 infection. Ask your healthcare provider to call the local or state health department.  Wear a facemask You should wear a facemask that covers your nose and mouth when you are in the same room with other people and when you visit a healthcare provider. People who live with or visit you should also wear a facemask while they are in the same room with you.  Separate yourself from other people in your home As much as possible, you should stay in a different room from other people in your home. Also, you should use a separate bathroom, if available.  Avoid sharing household items You should not  share dishes, drinking glasses, cups, eating utensils, towels, bedding, or other items with other people in your home. After using these items, you should wash them thoroughly with soap and water.  Cover your coughs and sneezes Cover your mouth and nose with a tissue when you cough or sneeze, or you can cough or sneeze into your sleeve. Throw used tissues in a lined trash can, and immediately wash your hands with soap and water for at least 20 seconds or use an alcohol-based hand rub.  Wash your [employer]hands Wash your hands often and thoroughly with soap and water for at least 20 seconds. You can use an alcohol-based hand sanitizer if soap and water are not available and if your hands are not visibly dirty. Avoid touching your eyes, nose, and mouth with unwashed hands.   Prevention Steps for Caregivers and Household Members of Individuals Confirmed to have, or Being Evaluated for, COVID-19 Infection Being Cared for in the Home  If you live with, or provide care at home for, a person confirmed to have, or being evaluated for, COVID-19 infection please follow these guidelines to prevent infection:  Follow healthcare providers instructions Make sure that you understand and can help the patient follow any healthcare provider instructions for all care.  Provide for the patients basic needs You should help the patient with basic needs in the home and provide support for getting groceries, prescriptions, and other personal needs.  Monitor the patients symptoms If they are getting sicker, call his or her medical provider and tell them that the patient has, or is being evaluated for, COVID-19 infection. This will help the healthcare providers office  take steps to keep other people from getting infected. Ask the healthcare provider to call the local or state health department.  Limit the number of people who have contact with the patient  If possible, have only one caregiver for the  patient.  Other household members should stay in another home or place of residence. If this is not possible, they should stay  in another room, or be separated from the patient as much as possible. Use a separate bathroom, if available.  Restrict visitors who do not have an essential need to be in the home.  Keep older adults, very young children, and other sick people away from the patient Keep older adults, very young children, and those who have compromised immune systems or chronic health conditions away from the patient. This includes people with chronic heart, lung, or kidney conditions, diabetes, and cancer.  Ensure good ventilation Make sure that shared spaces in the home have good air flow, such as from an air conditioner or an opened window, weather permitting.  Wash your hands often  Wash your hands often and thoroughly with soap and water for at least 20 seconds. You can use an alcohol based hand sanitizer if soap and water are not available and if your hands are not visibly dirty.  Avoid touching your eyes, nose, and mouth with unwashed hands.  Use disposable paper towels to dry your hands. If not available, use dedicated cloth towels and replace them when they become wet.  Wear a facemask and gloves  Wear a disposable facemask at all times in the room and gloves when you touch or have contact with the patients blood, body fluids, and/or secretions or excretions, such as sweat, saliva, sputum, nasal mucus, vomit, urine, or feces.  Ensure the mask fits over your nose and mouth tightly, and do not touch it during use.  Throw out disposable facemasks and gloves after using them. Do not reuse.  Wash your hands immediately after removing your facemask and gloves.  If your personal clothing becomes contaminated, carefully remove clothing and launder. Wash your hands after handling contaminated clothing.  Place all used disposable facemasks, gloves, and other waste in a lined  container before disposing them with other household waste.  Remove gloves and wash your hands immediately after handling these items.  Do not share dishes, glasses, or other household items with the patient  Avoid sharing household items. You should not share dishes, drinking glasses, cups, eating utensils, towels, bedding, or other items with a patient who is confirmed to have, or being evaluated for, COVID-19 infection.  After the person uses these items, you should wash them thoroughly with soap and water.  Wash laundry thoroughly  Immediately remove and wash clothes or bedding that have blood, body fluids, and/or secretions or excretions, such as sweat, saliva, sputum, nasal mucus, vomit, urine, or feces, on them.  Wear gloves when handling laundry from the patient.  Read and follow directions on labels of laundry or clothing items and detergent. In general, wash and dry with the warmest temperatures recommended on the label.  Clean all areas the individual has used often  Clean all touchable surfaces, such as counters, tabletops, doorknobs, bathroom fixtures, toilets, phones, keyboards, tablets, and bedside tables, every day. Also, clean any surfaces that may have blood, body fluids, and/or secretions or excretions on them.  Wear gloves when cleaning surfaces the patient has come in contact with.  Use a diluted bleach solution (e.g., dilute bleach with 1 part  bleach and 10 parts water) or a household disinfectant with a label that says EPA-registered for coronaviruses. To make a bleach solution at home, add 1 tablespoon of bleach to 1 quart (4 cups) of water. For a larger supply, add  cup of bleach to 1 gallon (16 cups) of water.  Read labels of cleaning products and follow recommendations provided on product labels. Labels contain instructions for safe and effective use of the cleaning product including precautions you should take when applying the product, such as wearing gloves or  eye protection and making sure you have good ventilation during use of the product.  Remove gloves and wash hands immediately after cleaning.  Monitor yourself for signs and symptoms of illness Caregivers and household members are considered close contacts, should monitor their health, and will be asked to limit movement outside of the home to the extent possible. Follow the monitoring steps for close contacts listed on the symptom monitoring form.   ? If you have additional questions, contact your local health department or call the epidemiologist on call at [phone] (available 24/7). ? This guidance is subject to change. For the most up-to-date guidance from [facility], please refer to their website: TripMetro.huhttps://www.cdc.gov/coronavirus/2019-ncov/hcp/guidance-prevent-spread.html     COVID-19 Frequently Asked Questions COVID-19 (coronavirus disease) is an infection that is caused by a large family of viruses. Some viruses cause illness in people and others cause illness in animals like camels, cats, and bats. In some cases, the viruses that cause illness in animals can spread to humans. Where did the coronavirus come from? In December 2019, Armeniahina told the [employer] Health [employer] [facility](WHO) of several cases of lung disease (human respiratory illness). These cases were linked to an open seafood and [place] market [place]. The link to the seafood and livestock market suggests that the virus may have spread from animals to humans. However, since that first outbreak in December, the virus has also been shown to spread from person to person. What is the name of the disease and the virus? Disease name Early on, this disease was called novel coronavirus. This is because scientists determined that the disease was caused by a new (novel) respiratory virus. The World Health Organization [facility](WHO) has now named the disease COVID-19, or coronavirus disease. Virus name The virus that causes the disease  is called severe acute respiratory syndrome coronavirus 2 (SARS-CoV-2). More information on disease and virus naming World Health Organization [facility](WHO): www.who.int/emergencies/diseases/novel-coronavirus-2019/technical-guidance/naming-the-coronavirus-disease-(covid-2019)-and-the-virus-that-causes-it Who is at risk for complications from coronavirus disease? Some people may be at higher risk for complications from coronavirus disease. This includes older adults and people who have chronic diseases, such as heart disease, diabetes, and lung disease. If you are at higher risk for complications, take these extra precautions:  Avoid close contact with people who are sick or have a fever or cough. Stay at least 3-6 ft (1-2 m) away from them, if possible.  Wash your hands often with soap and water for at least 20 seconds.  Avoid touching your face, mouth, nose, or eyes.  Keep supplies on hand at home, such as food, medicine, and cleaning supplies.  Stay home as much as possible.  Avoid social gatherings and travel. How does coronavirus disease spread? The virus that causes coronavirus disease spreads easily from person to person (is contagious). There are also cases of community-spread disease. This means the disease has spread to:  People who have no known contact with other infected people.  People who have not traveled to areas  where there are known cases. It appears to spread from one person to another through droplets from coughing or sneezing. Can I get the virus from touching surfaces or objects? There is still a lot that we do not know about the virus that causes coronavirus disease. Scientists are basing a lot of information on what they know about similar viruses, such as:  Viruses cannot generally survive on surfaces for long. They need a human body (host) to survive.  It is more likely that the virus is spread by close contact with people who are sick (direct contact), such as  through: ? Shaking hands or hugging. ? Breathing in respiratory droplets that travel through the air. This can happen when an infected person coughs or sneezes on or near other people.  It is less likely that the virus is spread when a person touches a surface or object that has the virus on it (indirect contact). The virus may be able to enter the body if the person touches a surface or object and then touches his or her face, eyes, nose, or mouth. Can a person spread the virus without having symptoms of the disease? It may be possible for the virus to spread before a person has symptoms of the disease, but this is most likely not the main way the virus is spreading. It is more likely for the virus to spread by being in close contact with people who are sick and breathing in the respiratory droplets of a sick person's cough or sneeze. What are the symptoms of coronavirus disease? Symptoms vary from person to person and can range from mild to severe. Symptoms may include:  Fever.  Cough.  Tiredness, weakness, or fatigue.  Fast breathing or feeling short of breath. These symptoms can appear anywhere from 2 to 14 days after you have been exposed to the virus. If you develop symptoms, call your health care provider. People with severe symptoms may need hospital care. If I am exposed to the virus, how long does it take before symptoms start? Symptoms of coronavirus disease may appear anywhere from 2 to 14 days after a person has been exposed to the virus. If you develop symptoms, call your health care provider. Should I be tested for this virus? Your health care provider will decide whether to test you based on your symptoms, history of exposure, and your risk factors. How does a health care provider test for this virus? Health care providers will collect samples to send for testing. Samples may include:  Taking a swab of fluid from the nose.  Taking fluid from the lungs by having you cough up  mucus (sputum) into a sterile cup.  Taking a blood sample.  Taking a stool or urine sample. Is there a treatment or vaccine for this virus? Currently, there is no vaccine to prevent coronavirus disease. Also, there are no medicines like antibiotics or antivirals to treat the virus. A person who becomes sick is given supportive care, which means rest and fluids. A person may also relieve his or her symptoms by using over-the-counter medicines that treat sneezing, coughing, and runny nose. These are the same medicines that a person takes for the common cold. If you develop symptoms, call your health care provider. People with severe symptoms may need hospital care. What can I do to protect myself and my family from this virus?     You can protect yourself and your family by taking the same actions that you would take  to prevent the spread of other viruses. Take the following actions:  Wash your hands often with soap and water for at least 20 seconds. If soap and water are not available, use alcohol-based hand sanitizer.  Avoid touching your face, mouth, nose, or eyes.  Cough or sneeze into a tissue, sleeve, or elbow. Do not cough or sneeze into your hand or the air. ? If you cough or sneeze into a tissue, throw it away immediately and wash your hands.  Disinfect objects and surfaces that you frequently touch every day.  Avoid close contact with people who are sick or have a fever or cough. Stay at least 3-6 ft (1-2 m) away from them, if possible.  Stay home if you are sick, except to get medical care. Call your health care provider before you get medical care.  Make sure your vaccines are up to date. Ask your health care provider what vaccines you need. What should I do if I need to travel? Follow travel recommendations from your local health authority, the CDC, and WHO. Travel information and advice  Centers for Disease Control and Prevention (CDC):  GeminiCard.glwww.cdc.gov/coronavirus/2019-ncov/travelers/index.html  World Health Organization [facility](WHO): PreviewDomains.sewww.who.int/emergencies/diseases/novel-coronavirus-2019/travel-advice Know the risks and take action to protect your health  You are at higher risk of getting coronavirus disease if you are traveling to areas with an outbreak or if you are exposed to travelers from areas with an outbreak.  Wash your hands often and practice good hygiene to lower the risk of catching or spreading the virus. What should I do if I am sick? General instructions to stop the spread of infection  Wash your hands often with soap and water for at least 20 seconds. If soap and water are not available, use alcohol-based hand sanitizer.  Cough or sneeze into a tissue, sleeve, or elbow. Do not cough or sneeze into your hand or the air.  If you cough or sneeze into a tissue, throw it away immediately and wash your hands.  Stay home unless you must get medical care. Call your health care provider or local health authority before you get medical care.  Avoid public areas. Do not take public transportation, if possible.  If you can, wear a mask if you must go out of the house or if you are in close contact with someone who is not sick. Keep your home clean  Disinfect objects and surfaces that are frequently touched every day. This may include: ? Counters and tables. ? Doorknobs and light switches. ? Sinks and faucets. ? Electronics such as phones, remote controls, keyboards, computers, and tablets.  Wash dishes in hot, soapy water or use a dishwasher. Air-dry your dishes.  Wash laundry in hot water. Prevent infecting other household members  Let healthy household members care for children and pets, if possible. If you have to care for children or pets, wash your hands often and wear a mask.  Sleep in a different bedroom or bed, if possible.  Do not share personal items, such as razors, toothbrushes, deodorant, combs,  brushes, towels, and washcloths. Where to find more information Centers for Disease Control and Prevention (CDC)  Information and news updates: CardRetirement.czwww.cdc.gov/coronavirus/2019-ncov World Health Organization [facility](WHO)  Information and news updates: AffordableSalon.eswww.who.int/emergencies/diseases/novel-coronavirus-2019  Coronavirus health topic: https://thompson-craig.com/www.who.int/health-topics/coronavirus  Questions and answers on COVID-19: kruiseway.comwww.who.int/news-room/q-a-detail/q-a-coronaviruses  Global tracker: who.sprinklr.com American Academy of Pediatrics (AAP)  Information for families: www.healthychildren.org/English/health-issues/conditions/chest-lungs/Pages/2019-Novel-Coronavirus.aspx The coronavirus situation is changing rapidly. Check your local health authority website or the CDC and [facility] websites for updates  and news. When should I contact a health care provider?  Contact your health care provider if you have symptoms of an infection, such as fever or cough, and you: ? Have been near anyone who is known to have coronavirus disease. ? Have come into contact with a person who is suspected to have coronavirus disease. ? Have traveled outside of the country. When should I get emergency medical care?  Get help right away by calling your local emergency services (911 in the [place] if you have: ? Trouble breathing. ? Pain or pressure in your chest. ? Confusion. ? Blue-tinged lips and fingernails. ? Difficulty waking from sleep. ? Symptoms that get worse. Let the emergency medical personnel know if you think you have coronavirus disease. Summary  A new respiratory virus is spreading from person to person and causing COVID-19 (coronavirus disease).  The virus that causes COVID-19 appears to spread easily. It spreads from one person to another through droplets from coughing or sneezing.  Older adults and those with chronic diseases are at higher risk of disease. If you are at higher risk for complications, take extra  precautions.  There is currently no vaccine to prevent coronavirus disease. There are no medicines, such as antibiotics or antivirals, to treat the virus.  You can protect yourself and your family by washing your hands often, avoiding touching your face, and covering your coughs and sneezes. This information is not intended to replace advice given to you by your health care provider. Make sure you discuss any questions you have with your health care provider. Document Released: 03/16/2019 Document Revised: 03/16/2019 Document Reviewed: 03/16/2019 Elsevier Patient Education  2020 Else[employer] Inc.     Prevent the Spread of COVID-19 if You Are Sick If you are sick with COVID-19 or think you might have COVID-19, follow the steps below to help protect other people in your home and community. Stay home except to get medical care.  Stay home. Most people with COVID-19 have mild illness and are able to recover at home without medical care. Do not leave your home, except to get medical care. Do not visit public areas.  Take care of yourself. Get rest and stay hydrated.  Get medical care when needed. Call your doctor before you go to their office for care. But, if you have trouble breathing or other concerning symptoms, call 911 for immediate help.  Avoid public transportation, ride-sharing, or taxis. Separate yourself from other people and pets in your home.  As much as possible, stay in a specific room and away from other people and pets in your home. Also, you should use a separate bathroom, if available. If you need to be around other people or animals in or outside of the home, wear a cloth face covering. ? See COVID-19 and Animals if you have questions about pets: RebateDates.com.br Monitor your symptoms.  Common symptoms of COVID-19 include fever and cough. Trouble breathing is a more serious symptom that means you should get medical  attention.  Follow care instructions from your healthcare provider and local health department. Your local health authorities will give instructions on checking your symptoms and reporting information. If you develop emergency warning signs for COVID-19 get medical attention immediately.  Emergency warning signs include*:  Trouble breathing  Persistent pain or pressure in the chest  New confusion or not able to be woken  Bluish lips or face *This list is not all inclusive. Please consult your medical provider for any other symptoms that are  severe or concerning to you. Call 911 if you have a medical emergency. If you have a medical emergency and need to call 911, notify the operator that you have or think you might have, COVID-19. If possible, put on a facemask before medical help arrives. Call ahead before visiting your doctor.  Call ahead. Many medical visits for routine care are being postponed or done by phone or telemedicine.  If you have a medical appointment that cannot be postponed, call your doctor's office. This will help the office protect themselves and other patients. If you are sick, wear a cloth covering over your nose and mouth.  You should wear a cloth face covering over your nose and mouth if you must be around other people or animals, including pets (even at home).  You don't need to wear the cloth face covering if you are alone. If you can't put on a cloth face covering (because of trouble breathing for example), cover your coughs and sneezes in some other way. Try to stay at least 6 feet away from other people. This will help protect the people around you. Note: During the COVID-19 pandemic, medical grade facemasks are reserved for healthcare workers and some first responders. You may need to make a cloth face covering using a scarf or bandana. Cover your coughs and sneezes.  Cover your mouth and nose with a tissue when you cough or sneeze.  Throw used tissues in a  lined trash can.  Immediately wash your hands with soap and water for at least 20 seconds. If soap and water are not available, clean your hands with an alcohol-based hand sanitizer that contains at least 60% alcohol. Clean your hands often.  Wash your hands often with soap and water for at least 20 seconds. This is especially important after blowing your nose, coughing, or sneezing; going to the bathroom; and before eating or preparing food.  Use hand sanitizer if soap and water are not available. Use an alcohol-based hand sanitizer with at least 60% alcohol, covering all surfaces of your hands and rubbing them together until they feel dry.  Soap and water are the best option, especially if your hands are visibly dirty.  Avoid touching your eyes, nose, and mouth with unwashed hands. Avoid sharing personal household items.  Do not share dishes, drinking glasses, cups, eating utensils, towels, or bedding with other people in your home.  Wash these items thoroughly after using them with soap and water or put them in the dishwasher. Clean all "high-touch" surfaces everyday.  Clean and disinfect high-touch surfaces in your "sick room" and bathroom. Let someone else clean and disinfect surfaces in common areas, but not your bedroom and bathroom.  If a caregiver or other person needs to clean and disinfect a sick person's bedroom or bathroom, they should do so on an as-needed basis. The caregiver/other person should wear a mask and wait as long as possible after the sick person has used the bathroom. High-touch surfaces include phones, remote controls, counters, tabletops, doorknobs, bathroom fixtures, toilets, keyboards, tablets, and bedside tables.  Clean and disinfect areas that may have blood, stool, or body fluids on them.  Use household cleaners and disinfectants. Clean the area or item with soap and water or another detergent if it is dirty. Then use a household disinfectant. ? Be sure to  follow the instructions on the label to ensure safe and effective use of the product. Many products recommend keeping the surface wet for several minutes to ensure  germs are killed. Many also recommend precautions such as wearing gloves and making sure you have good ventilation during use of the product. ? Most EPA-registered household disinfectants should be effective. How to discontinue home isolation  People with COVID-19 who have stayed home (home isolated) can stop home isolation under the following conditions: ? If you will not have a test to determine if you are still contagious, you can leave home after these three things have happened:  You have had no fever for at least 72 hours (that is three full days of no fever without the use of medicine that reduces fevers) AND  other symptoms have improved (for example, when your cough or shortness of breath has improved) AND  at least 10 days have passed since your symptoms first appeared. ? If you will be tested to determine if you are still contagious, you can leave home after these three things have happened:  You no longer have a fever (without the use of medicine that reduces fevers) AND  other symptoms have improved (for example, when your cough or shortness of breath has improved) AND  you received two negative tests in a row, 24 hours apart. Your doctor will follow CDC guidelines. In all cases, follow the guidance of your healthcare provider and local health department. The decision to stop home isolation should be made in consultation with your healthcare provider and state and local health departments. Local decisions depend on local circumstances. [employer] 04/04/2019 This information is not intended to replace advice given to you by your health care provider. Make sure you discuss any questions you have with your health care provider. Document Released: 03/16/2019 Document Revised: 04/14/2019 Document Reviewed:  03/16/2019 Elsevier Patient Education  2020 [employer].

## 2019-06-29 NOTE — Progress Notes (Signed)
Went over AVS with patient.  He verbalized understanding of all discharge instructions.  Form signed and in chart.  Patient left via w/c and personal vehicle.  Virginia Rochester, RN

## 2020-01-26 ENCOUNTER — Encounter (HOSPITAL_COMMUNITY): Payer: Self-pay

## 2020-01-26 ENCOUNTER — Ambulatory Visit (HOSPITAL_COMMUNITY)
Admission: EM | Admit: 2020-01-26 | Discharge: 2020-01-26 | Disposition: A | Discharge status: Home / Self Care | Payer: Self-pay | Attending: Family Medicine | Admitting: Family Medicine

## 2020-01-26 ENCOUNTER — Other Ambulatory Visit: Payer: Self-pay

## 2020-01-26 DIAGNOSIS — M5442 Lumbago with sciatica, left side: Secondary | ICD-10-CM

## 2020-01-26 LAB — POCT URINALYSIS DIP (DEVICE)
Bilirubin Urine: NEGATIVE
Glucose, UA: NEGATIVE mg/dL
Ketones, ur: NEGATIVE mg/dL
Leukocytes,Ua: NEGATIVE
Nitrite: NEGATIVE
Protein, ur: NEGATIVE mg/dL
Specific Gravity, Urine: >=1.03 (ref 1.005–1.030)
Urobilinogen, UA: 0.2 mg/dL (ref 0.0–1.0)
pH: 6 (ref 5.0–8.0)

## 2020-01-26 MED ORDER — CYCLOBENZAPRINE HCL 5 MG PO TABS: 5.0000 mg | ORAL_TABLET | Freq: Two times a day (BID) | ORAL | 0 refills | Status: DC | PRN

## 2020-01-26 MED ORDER — KETOROLAC TROMETHAMINE 60 MG/2ML IM SOLN
INTRAMUSCULAR | Status: AC
  Filled 2020-01-26: qty 2

## 2020-01-26 MED ORDER — DEXAMETHASONE SODIUM PHOSPHATE 10 MG/ML IJ SOLN
INTRAMUSCULAR | Status: AC
  Filled 2020-01-26: qty 1

## 2020-01-26 MED ORDER — PREDNISONE 10 MG PO TABS: ORAL_TABLET | ORAL | 0 refills | Status: DC

## 2020-01-26 MED ORDER — KETOROLAC TROMETHAMINE 60 MG/2ML IM SOLN
60.0000 mg | Freq: Once | INTRAMUSCULAR | Status: AC
  Administered 2020-01-26: 17:00:00 60 mg via INTRAMUSCULAR

## 2020-01-26 MED ORDER — DEXAMETHASONE SODIUM PHOSPHATE 10 MG/ML IJ SOLN
10.0000 mg | Freq: Once | INTRAMUSCULAR | Status: AC
  Administered 2020-01-26: 17:00:00 10 mg via INTRAMUSCULAR

## 2020-01-26 NOTE — ED Triage Notes (Signed)
Pt states he has back pain x 3 week. Pt states he has been taking Tylenol.

## 2020-01-26 NOTE — Discharge Instructions (Addendum)
We gave you an injection of Toradol and Decadron today for your back.  Continue with prednisone taper over the next 6 days-begin with 6 tablets on day 1, decrease by 1 tablet each day until you complete-6, 5, 4, 3, 2, 1-please take with food and in the morning if you are able You may use flexeril/cyclobenzaprine as needed to help with pain. This is a muscle relaxer and causes sedation- please use only at bedtime or when you will be home and not have to drive/work Avoid complete bed rest, avoid heavy lifting, please go about daily activities Gentle stretching as pain tolerates  Please follow-up if pain not improving worsening, developing increased pain, leg weakness, issues controlling urination or bowel movement

## 2020-01-26 NOTE — ED Provider Notes (Signed)
[facility]    CSN: [id number] Arrival date & time: 01/26/20  1619      History   Chief Complaint Chief Complaint  Patient presents with  . Back Pain    HPI Thomas Saunders is a 54 y.o. male presenting today for evaluation of back pain.  Patient states that over the past 3 weeks he has had left-sided lower back pain.  Pain will radiate into his left leg.  Reports a numbness tingling sensation that radiates.  Denies any specific injury or fall, but onset did occur while walking.  Pain is persisted despite use of aspirin, Tylenol and ibuprofen.  Denies history of diabetes.  Denies history of similar.  Denies any urinary symptoms of dysuria, hematuria, increased frequency.  Denies history of kidney stones.  HPI  History reviewed. No pertinent past medical history.  Patient Active Problem List   Diagnosis Date Noted  . Acute respiratory failure with hypoxia ([place]) 06/25/2019  . Severe sepsis ([place]) 06/25/2019  . AKI (acute kidney injury) ([place]) 06/25/2019  . Hypokalemia 06/25/2019  . COVID-19 virus infection   . Pneumonia due to severe acute respiratory syndrome coronavirus 2 (SARS-CoV-2) 06/24/2019    History reviewed. No pertinent surgical history.     Home Medications    Prior to Admission medications   Medication Sig Start Date End Date Taking? Authorizing Provider  aspirin EC 81 MG tablet Take 81 mg by mouth daily.    [provider]  cyclobenzaprine (FLEXERIL) 5 MG tablet Take 1-2 tablets (5-10 mg total) by mouth 2 (two) times daily as needed for muscle spasms. 01/26/20   Thomas Dunnam C, PA-Saunders  predniSONE (DELTASONE) 10 MG tablet Begin with 6 tabs on day 1, 5 tab on day 2, 4 tab on day 3, 3 tab on day 4, 2 tab on day 5, 1 tab on day 6-take with food 01/26/20   Thomas Lawson C, PA-Saunders  albuterol (VENTOLIN HFA) 108 (90 Base) MCG/ACT inhaler Inhale 2 puffs into the lungs every 4 (four) hours as needed for wheezing or shortness of breath. Patient not  taking: Reported on 01/26/2020 06/29/19 01/26/20  Thomas Osgood, MD    Family History History reviewed. No pertinent family history.  Social History Social History   Tobacco Use  . Smoking status: Never Smoker  . Smokeless tobacco: Never Used  Substance Use Topics  . Alcohol use: Never  . Drug use: Never     Allergies   Patient has no known allergies.   Review of Systems Review of Systems  Constitutional: Negative for fatigue and fever.  Eyes: Negative for redness, itching and visual disturbance.  Respiratory: Negative for shortness of breath.   Cardiovascular: Negative for chest pain and leg swelling.  Gastrointestinal: Negative for nausea and vomiting.  Genitourinary: Negative for decreased urine volume, dysuria and hematuria.  Musculoskeletal: Positive for back pain and myalgias. Negative for arthralgias.  Skin: Negative for color change, rash and wound.  Neurological: Negative for dizziness, syncope, weakness, light-headedness and headaches.     Physical Exam Triage Vital Signs ED Triage Vitals  Enc Vitals Group     BP      Pulse      Resp      Temp      Temp src      SpO2      Weight      Height      Head Circumference      Peak Flow  Pain Score      Pain Loc      Pain Edu?      Excl. in GC?    No data found.  Updated Vital Signs BP 127/80 (BP Location: Right Arm)   Pulse 92   Temp 99.1 F (37.3 Saunders)   Resp 16   Wt 176 lb (79.8 kg)   SpO2 100%   BMI 27.57 kg/m   Visual Acuity Right Eye Distance:   Left Eye Distance:   Bilateral Distance:    Right Eye Near:   Left Eye Near:    Bilateral Near:     Physical Exam Vitals and nursing note reviewed.  Constitutional:      Appearance: He is well-developed.     Comments: No acute distress  HENT:     Head: Normocephalic and atraumatic.     Nose: Nose normal.  Eyes:     Conjunctiva/sclera: Conjunctivae normal.  Cardiovascular:     Rate and Rhythm: Normal rate.  Pulmonary:      Effort: Pulmonary effort is normal. No respiratory distress.  Abdominal:     General: There is no distension.  Musculoskeletal:        General: Normal range of motion.     Cervical back: Neck supple.     Comments: Standing for comfort Diffuse tenderness throughout lumbar spine midline, no focal tenderness, deformity or step-off, increased tenderness throughout left lumbar musculature more laterally extending into upper gluteal Strength at hips 5/5 ankle bilaterally, pain elicited with resisted hip flexion and knee flexion. Patellar reflex 1+ bilaterally Positive straight leg raise on left  Skin:    General: Skin is warm and dry.  Neurological:     Mental Status: He is alert and oriented to person, place, and time.      UC Treatments / Results  Labs (all labs ordered are listed, but only abnormal results are displayed) Labs Reviewed  POCT URINALYSIS DIP (DEVICE) - Abnormal; Notable for the following components:      Result Value   Hgb urine dipstick TRACE (*)    All other components within normal limits    EKG   Radiology No results found.  Procedures Procedures (including critical care time)  Medications Ordered in UC Medications  ketorolac (TORADOL) injection 60 mg (has no administration in time range)  dexamethasone (DECADRON) injection 10 mg (has no administration in time range)    Initial Impression / Assessment and Plan / UC Course  I have reviewed the triage vital signs and the nursing notes.  Pertinent labs & imaging results that were available during my care of the patient were reviewed by me and considered in my medical decision making (see chart for details).    1.  Low back pain with left-sided radiculopathy UA with trace hemoglobin, otherwise unremarkable.  Do not suspect stone at this time, exam and history most suggestive of sciatica, treating with Toradol and Decadron IM prior to discharge followed by 6-day prednisone taper and Flexeril as needed to  supplement.  Discussed activity modification.  No red flags for cauda equina.  Discussed strict return precautions. Patient verbalized understanding and is agreeable with plan.  Final Clinical Impressions(s) / UC Diagnoses   Final diagnoses:  Acute left-sided low back pain with left-sided sciatica     Discharge Instructions     We gave you an injection of Toradol and Decadron today for your back.  Continue with prednisone taper over the next 6 days-begin with 6 tablets on day 1,  decrease by 1 tablet each day until you complete-6, 5, 4, 3, 2, 1-please take with food and in the morning if you are able You may use flexeril/cyclobenzaprine as needed to help with pain. This is a muscle relaxer and causes sedation- please use only at bedtime or when you will be home and not have to drive/work Avoid complete bed rest, avoid heavy lifting, please go about daily activities Gentle stretching as pain tolerates  Please follow-up if pain not improving worsening, developing increased pain, leg weakness, issues controlling urination or bowel movement     ED Prescriptions    Medication Sig Dispense Auth. Provider   predniSONE (DELTASONE) 10 MG tablet Begin with 6 tabs on day 1, 5 tab on day 2, 4 tab on day 3, 3 tab on day 4, 2 tab on day 5, 1 tab on day 6-take with food 21 tablet Aniello Christopoulos C, PA-Saunders   cyclobenzaprine (FLEXERIL) 5 MG tablet Take 1-2 tablets (5-10 mg total) by mouth 2 (two) times daily as needed for muscle spasms. 24 tablet Shirlyn Savin, [place] C, PA-Saunders     PDMP not reviewed this encounter.   Lew Dawes, PA-Saunders 01/26/20 1704

## 2020-09-27 IMAGING — DX PORTABLE CHEST - 1 VIEW
1 series · 1 of 1 positions shown · non-contrast
Comparison: None.

CLINICAL DATA: 53-year-old male with shortness of breath and cough.

EXAM:
PORTABLE CHEST 1 VIEW

[chest]
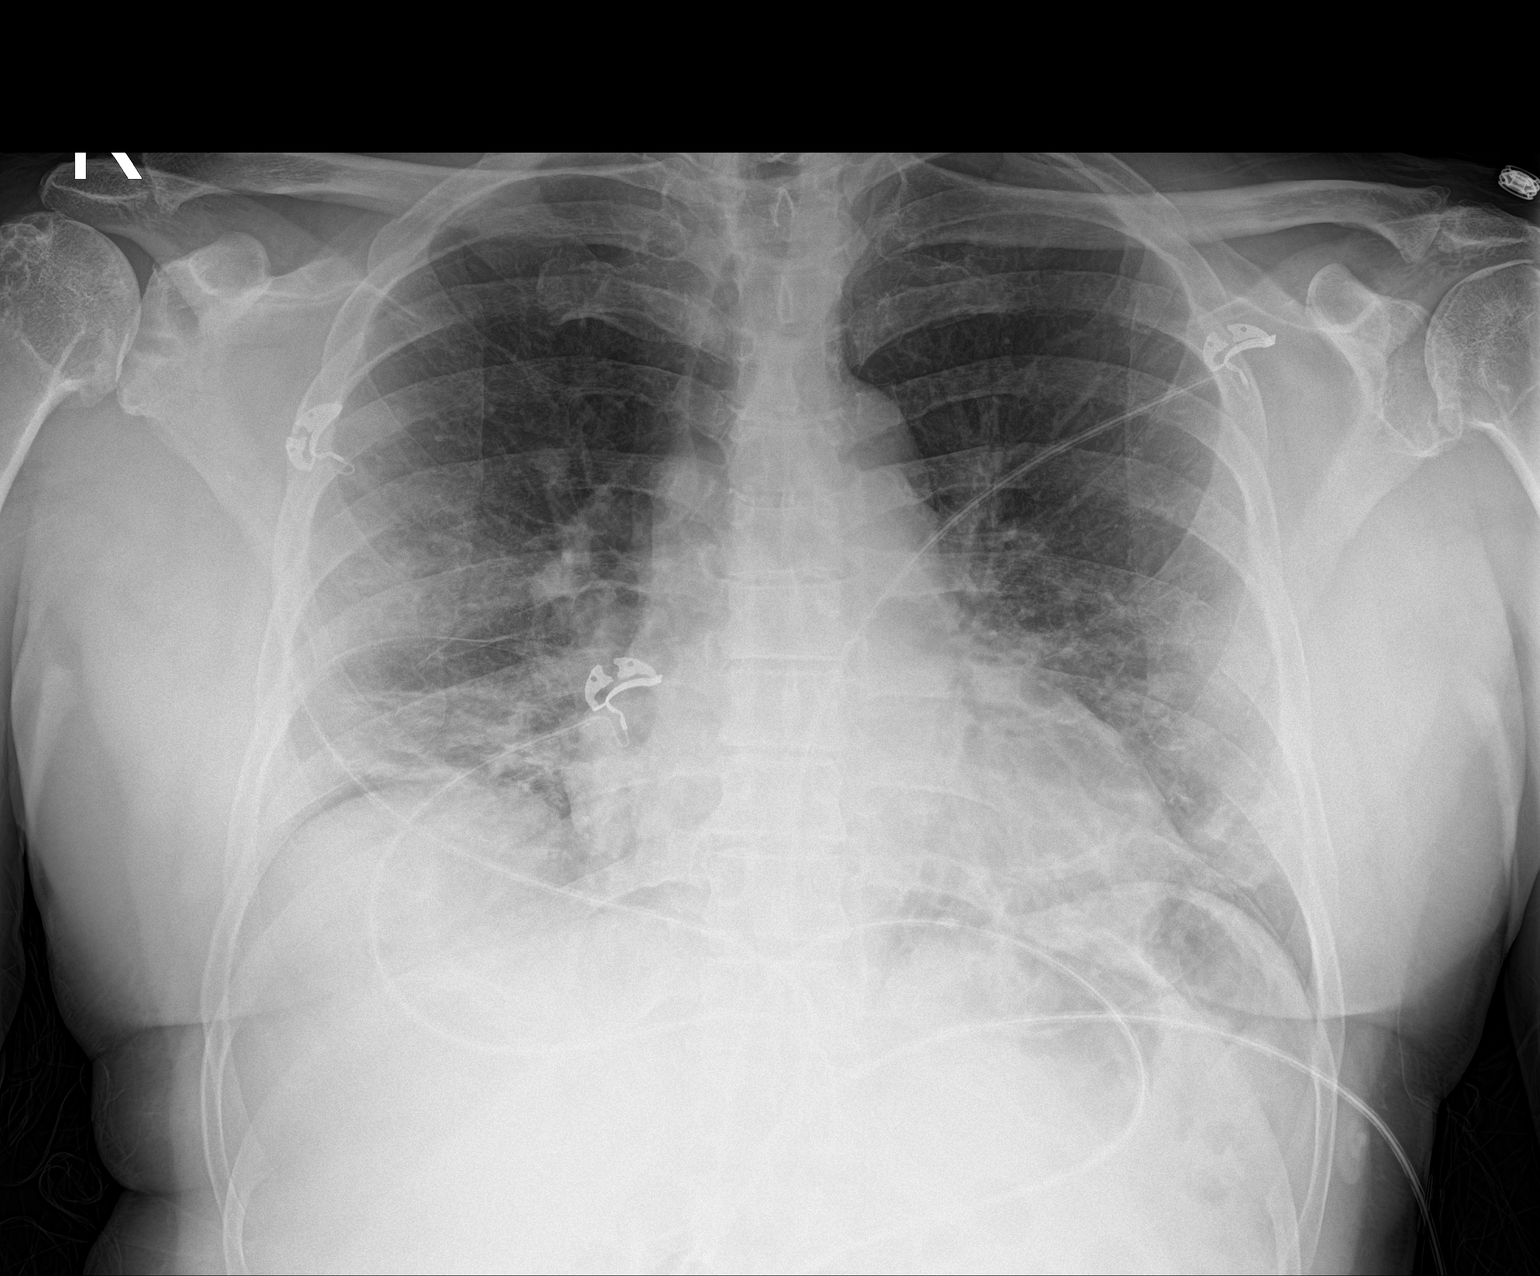

[1 of 1 positions shown; findings below may reference images not displayed]

FINDINGS: Patchy bilateral hazy densities primarily involving the mid to lower
lung field noted which although may be related to atelectatic
changes are concerning for developing infiltrate. Clinical
correlation is recommended. No pleural effusion or pneumothorax. The
cardiac silhouette is within normal limits. No acute osseous
pathology.
IMPRESSION: Bilateral hazy densities concerning for developing infiltrate.

## 2021-12-12 ENCOUNTER — Ambulatory Visit (INDEPENDENT_AMBULATORY_CARE_PROVIDER_SITE_OTHER): Payer: Self-pay | Admitting: Internal Medicine

## 2021-12-12 VITALS — BP 114/75 | HR 79 | Temp 98.3°F | Ht 67.0 in | Wt 172.8 lb

## 2021-12-12 DIAGNOSIS — Z114 Encounter for screening for human immunodeficiency virus [HIV]: Secondary | ICD-10-CM

## 2021-12-12 DIAGNOSIS — N179 Acute kidney failure, unspecified: Secondary | ICD-10-CM

## 2021-12-12 DIAGNOSIS — G8929 Other chronic pain: Secondary | ICD-10-CM

## 2021-12-12 DIAGNOSIS — R7989 Other specified abnormal findings of blood chemistry: Secondary | ICD-10-CM

## 2021-12-12 DIAGNOSIS — Z0000 Encounter for general adult medical examination without abnormal findings: Secondary | ICD-10-CM

## 2021-12-12 DIAGNOSIS — M545 Low back pain, unspecified: Secondary | ICD-10-CM

## 2021-12-12 DIAGNOSIS — Z1159 Encounter for screening for other viral diseases: Secondary | ICD-10-CM

## 2021-12-12 NOTE — Progress Notes (Addendum)
° °  CC: Establish care, chronic back pain  HPI:  [name] is a 56 y.o. with a history of chronic lower back pain presenting to establish care. For details of today's visit and the status of his chronic medical issues please refer to the assessment and plan.  No past medical history on file.  PMHx- chronic lower back pain  FHx- no known family history  Social history-patient was recently incarcerated but has been out for the past 3 years.  He denies any tobacco, alcohol or substance use.  No history of sexually transmitted diseases.  He lives here in [place].  Surgeries- in grown hair on scalp, sinus surgery, back surgery for a skin lesion  Medications- none  Allergies- NKDA  Review of Systems:   Review of Systems  Constitutional:  Negative for chills, fever and malaise/fatigue.  Respiratory:  Negative for shortness of breath.   Cardiovascular:  Negative for chest pain and leg swelling.  Gastrointestinal:  Negative for blood in stool, constipation and diarrhea.  Musculoskeletal:  Positive for back pain. Negative for falls.  Neurological:  Negative for weakness and headaches.  Psychiatric/Behavioral:  Negative for substance abuse.     Physical Exam:  Vitals:   12/12/21 1329  BP: 114/75  Pulse: 79  Temp: 98.3 F (36.8 C)  TempSrc: Oral  SpO2: 100%  Weight: 172 lb 12.8 oz (78.4 kg)  Height: 5\' 7"  (1.702 m)   Physical Exam General: alert, appears stated age, in no acute distress HEENT: Normocephalic, atraumatic, EOM intact, conjunctiva normal CV: Regular rate and rhythm, no murmurs rubs or gallops Pulm: Clear to auscultation bilaterally, normal work of breathing Abdomen: Soft, nondistended, bowel sounds present, no tenderness to palpation MSK: No lower extremity edema, lower extremity strength 5 out of 5, sensation intact.  Normal flexion extension of the spine. Skin: Warm and dry Neuro: Alert and oriented x3   Assessment & Plan:   See Encounters Tab for  problem based charting.  Patient discussed with Dr.  Saverio Danker

## 2021-12-12 NOTE — Patient Instructions (Signed)
It was a pleasure meeting you today!  Today we discussed the following:  Back pain: For this and wanted to continue taking ibuprofen up to 600 mg every 5 hours as needed.  He can take up to 3200 mg a day.  I have provided you with some paperwork for A letter which will help cover physical therapy.  Once this is completed let me know and I will send a referral in for physical therapy.  If your symptoms persist or worsen please give me a call.  Healthcare maintenance: I have ordered some blood work today.  If any results are abnormal I will give you a call.

## 2021-12-13 ENCOUNTER — Encounter: Payer: Self-pay | Admitting: Internal Medicine

## 2021-12-13 DIAGNOSIS — R7989 Other specified abnormal findings of blood chemistry: Secondary | ICD-10-CM | POA: Insufficient documentation

## 2021-12-13 DIAGNOSIS — Z0000 Encounter for general adult medical examination without abnormal findings: Secondary | ICD-10-CM | POA: Insufficient documentation

## 2021-12-13 DIAGNOSIS — G8929 Other chronic pain: Secondary | ICD-10-CM | POA: Insufficient documentation

## 2021-12-13 DIAGNOSIS — M549 Dorsalgia, unspecified: Secondary | ICD-10-CM | POA: Insufficient documentation

## 2021-12-13 LAB — CBC WITH DIFFERENTIAL/PLATELET
Basophils Absolute: 0 10*3/uL (ref 0.0–0.2)
Basos: 1 %
EOS (ABSOLUTE): 0.2 10*3/uL (ref 0.0–0.4)
Eos: 3 %
Hematocrit: 44.9 % (ref 37.5–51.0)
Hemoglobin: 14.8 g/dL (ref 13.0–17.7)
Immature Grans (Abs): 0 10*3/uL (ref 0.0–0.1)
Immature Granulocytes: 0 %
Lymphocytes Absolute: 2.8 10*3/uL (ref 0.7–3.1)
Lymphs: 39 %
MCH: 27.3 pg (ref 26.6–33.0)
MCHC: 33 g/dL (ref 31.5–35.7)
MCV: 83 fL (ref 79–97)
Monocytes Absolute: 0.6 10*3/uL (ref 0.1–0.9)
Monocytes: 9 %
Neutrophils Absolute: 3.5 10*3/uL (ref 1.4–7.0)
Neutrophils: 48 %
Platelets: 246 10*3/uL (ref 150–450)
RBC: 5.43 x10E6/uL (ref 4.14–5.80)
RDW: 13.6 % (ref 11.6–15.4)
WBC: 7.2 10*3/uL (ref 3.4–10.8)

## 2021-12-13 LAB — CMP14 + ANION GAP
ALT: 29 IU/L (ref 0–44)
AST: 37 IU/L (ref 0–40)
Albumin/Globulin Ratio: 2.1 (ref 1.2–2.2)
Albumin: 5.2 g/dL — ABNORMAL HIGH (ref 3.8–4.9)
Alkaline Phosphatase: 85 IU/L (ref 44–121)
Anion Gap: 12 mmol/L (ref 10.0–18.0)
BUN/Creatinine Ratio: 16 (ref 9–20)
BUN: 17 mg/dL (ref 6–24)
Bilirubin Total: 0.6 mg/dL (ref 0.0–1.2)
CO2: 27 mmol/L (ref 20–29)
Calcium: 9.7 mg/dL (ref 8.7–10.2)
Chloride: 100 mmol/L (ref 96–106)
Creatinine, Ser: 1.08 mg/dL (ref 0.76–1.27)
Globulin, Total: 2.5 g/dL (ref 1.5–4.5)
Glucose: 77 mg/dL (ref 70–99)
Potassium: 4.8 mmol/L (ref 3.5–5.2)
Sodium: 139 mmol/L (ref 134–144)
Total Protein: 7.7 g/dL (ref 6.0–8.5)
eGFR: 80 mL/min/{1.73_m2} (ref >59)

## 2021-12-13 LAB — HIV ANTIBODY (ROUTINE TESTING W REFLEX): HIV Screen 4th Generation wRfx: NONREACTIVE

## 2021-12-13 LAB — HEPATITIS C ANTIBODY: Hep C Virus Ab: <0.1 s/co ratio (ref 0.0–0.9)

## 2021-12-13 NOTE — Assessment & Plan Note (Addendum)
Patient states that he had a colonoscopy in the past.  Will await for prior medical records to determine when his next colonoscopy is due.  Patient is also going to apply for the orange card and CAF a letter.  Hepatitis C antibody and HIV antibody negative.

## 2021-12-13 NOTE — Assessment & Plan Note (Signed)
Patient had evaded LFTs back in 2020 likely in the setting of a COVID infection.  He also had a mild AKI at that time which improved.  CMP checked during today's visit shows normal LFTs and kidney function.

## 2021-12-13 NOTE — Assessment & Plan Note (Signed)
Patient presents for evaluation of chronic lower back pain, sharp in nature.  Endorses daily pain which starts out as a 5 out of 10 and increases to 8 out of 10 throughout the day.  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  Denies any injuries or trauma to his back.  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  States that he has had imaging in the past, and is going to sign a medical release form for [place] to obtain these records from [employer] [facility].  Denies any difficulty urinating or with bowel movements.  No numbness in his pelvic region.  Assessment/plan: Patient has chronic lower back pain without any red flag symptoms.  Recommended conservative management with ibuprofen as needed for severe pain.  Will refer to physical therapy once patient applies for orange card, paperwork provided.  Will also look out for medical records to review prior imaging.

## 2021-12-20 NOTE — Progress Notes (Signed)
Internal Medicine Clinic Attending ° °Case discussed with Dr. Rehman  At the time of the visit.  We reviewed the resident’s history and exam and pertinent patient test results.  I agree with the assessment, diagnosis, and plan of care documented in the resident’s note.  ° °

## 2021-12-26 ENCOUNTER — Telehealth: Payer: Self-pay | Admitting: Student

## 2021-12-26 NOTE — Telephone Encounter (Signed)
Pt requesting a call back.  PT states his Ibuprofen was not called in on 12/12/2021.  Please call the patient back.

## 2021-12-26 NOTE — Telephone Encounter (Signed)
RTC to patient.  Message was left that the [facility] had called and for patient to return call if needed.

## 2021-12-27 ENCOUNTER — Other Ambulatory Visit: Payer: Self-pay | Admitting: Student

## 2021-12-27 DIAGNOSIS — G8929 Other chronic pain: Secondary | ICD-10-CM

## 2021-12-27 MED ORDER — IBUPROFEN 600 MG PO TABS: 600.0000 mg | ORAL_TABLET | Freq: Four times a day (QID) | ORAL | 2 refills | Status: AC | PRN

## 2021-12-27 NOTE — Telephone Encounter (Signed)
Pt called / informed of Ibuprofen rx - sent to [facility] on [address].

## 2021-12-27 NOTE — Telephone Encounter (Signed)
Called pt - stated he needs rx for Ibuprofen 600 mg sent to the pharmacy for the back pain. Last OV was 12/12/21. Thanks

## 2022-03-21 ENCOUNTER — Encounter: Payer: Self-pay | Admitting: Student

## 2022-03-21 ENCOUNTER — Ambulatory Visit (INDEPENDENT_AMBULATORY_CARE_PROVIDER_SITE_OTHER): Payer: Self-pay | Admitting: Student

## 2022-03-21 VITALS — BP 107/49 | HR 93 | Temp 98.1°F | Ht 67.0 in | Wt 172.1 lb

## 2022-03-21 DIAGNOSIS — Z0000 Encounter for general adult medical examination without abnormal findings: Secondary | ICD-10-CM

## 2022-03-21 DIAGNOSIS — E663 Overweight: Secondary | ICD-10-CM

## 2022-03-21 DIAGNOSIS — G8929 Other chronic pain: Secondary | ICD-10-CM

## 2022-03-21 DIAGNOSIS — Z23 Encounter for immunization: Secondary | ICD-10-CM

## 2022-03-21 DIAGNOSIS — M545 Low back pain, unspecified: Secondary | ICD-10-CM

## 2022-03-21 NOTE — Patient Instructions (Addendum)
It was a pleasure meeting you today sir. For your back pain taking ibuprofen when the pain is really severe is okay. Try to avoid taking this multiple times a week as it can lead to issues with your stomach.  ? ? ?Please follow up with optometry for an eye exam.  ? ? ? ?

## 2022-03-26 NOTE — Assessment & Plan Note (Signed)
Patient states that his pain is improved. He states he does have similar back pain to his previous clinic visit when he is overly active. He states that rest and as needed tylenol/ibuprofen, help with these symptoms.  ? ?A/P: Because his symptoms are less severe and more infrequent, discussed with patient that he should continue with as needed tylenol/ibuprofen and to decrease the intensity of his workouts when he has a flare of his back pain. Moreover, we discussed stretching exercises that he could perform. He continues to deny LE weakness/dysesthesias, bowel or bladder incontinence.  ? ?Will follow up with our staff regarding requesting records from [facility], for previous imaging.  ? ?Patient will follow up as needed for his back pain ?

## 2022-03-26 NOTE — Progress Notes (Signed)
? ?  CC: Back pain  ? ?HPI: ? ?Thomas Saunders is a 56 y.o. with pmh per below who presents for follow up regarding his chronic back pain. Please see problem based charting under encounters tab for further details.   ? ?Past Medical History:  ?Diagnosis Date  ? COVID-19 virus infection   ? ?Review of Systems:  Please see problem based charting under encounters tab for further details.   ? ?Physical Exam: ? ?Vitals:  ? 03/21/22 1322  ?BP: (!) 107/49  ?Pulse: 93  ?Temp: 98.1 ?F (36.7 ?C)  ?TempSrc: Oral  ?SpO2: 96%  ?Weight: 172 lb 1.6 oz (78.1 kg)  ?Height: 5\' 7"  (1.702 m)  ? ?Constitutional: Well-developed, well-nourished, and in no distress.  ?HENT:  ?Head: Normocephalic and atraumatic.  ?Eyes: EOM are normal.  ?Neck: Normal range of motion.  ?Cardiovascular: Normal rate, regular rhythm, intact distal pulses. No gallop and no friction rub.  ?No murmur heard. No lower extremity edema  ?Pulmonary: Non labored breathing on room air, no wheezing or rales  ?Abdominal: Soft. Normal bowel sounds. Non distended and non tender ?Musculoskeletal: Normal range of motion, no midline TTP of spine.     ?   General: No tenderness or edema.  ?Neurological: Alert and oriented to person, place, and time. Non focal. BLE 5/5 ?Skin: Skin is warm and dry.  ? ? ?Assessment & Plan:  ? ?See Encounters Tab for problem based charting. ? ?Patient discussed with Dr. ? ?

## 2022-03-26 NOTE — Assessment & Plan Note (Signed)
Patient now has orange card. He does note that he had a colonoscopy in 2019. 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 We will continued to try and obtain these records.  ?-Patient given Tdap this clinic visit.  ?

## 2022-03-27 NOTE — Progress Notes (Signed)
Internal Medicine Clinic Attending  Case discussed with Dr. Carter  At the time of the visit.  We reviewed the resident's history and exam and pertinent patient test results.  I agree with the assessment, diagnosis, and plan of care documented in the resident's note.  

## 2023-05-05 ENCOUNTER — Encounter: Payer: Self-pay | Admitting: *Deleted

## 2024-05-24 ENCOUNTER — Encounter: Payer: Self-pay | Admitting: Student

## 2024-06-03 ENCOUNTER — Encounter: Payer: Self-pay | Admitting: Student

## 2024-08-19 ENCOUNTER — Telehealth: Payer: Self-pay

## 2024-08-19 NOTE — Telephone Encounter (Signed)
 Patient last seen 03/21/22 I called the patient to see if he is still our patient and to schedule a appointment. Unable to reach the patient, I lvm for him to give us  a call back.
